# Patient Record
Sex: Female | Born: 1970 | Race: White | Hispanic: No | Marital: Married | Smoking: Current every day smoker
Health system: Southern US, Community
[De-identification: ages and names within clinical notes are randomized; demographics above are authoritative.]

## PROBLEM LIST (undated history)

## (undated) DIAGNOSIS — Z9081 Acquired absence of spleen: Secondary | ICD-10-CM

## (undated) DIAGNOSIS — I81 Portal vein thrombosis: Secondary | ICD-10-CM

## (undated) DIAGNOSIS — Z789 Other specified health status: Secondary | ICD-10-CM

## (undated) DIAGNOSIS — D649 Anemia, unspecified: Secondary | ICD-10-CM

## (undated) HISTORY — DX: Anemia, unspecified: D64.9

## (undated) HISTORY — PX: TUBAL LIGATION: SHX77

## (undated) HISTORY — DX: Portal vein thrombosis: I81

## (undated) HISTORY — DX: Acquired absence of spleen: Z90.81

---

## 1994-01-31 HISTORY — PX: SPLENECTOMY, TOTAL: SHX788

## 1994-01-31 HISTORY — PX: NEPHRECTOMY: SHX65

## 2015-01-14 ENCOUNTER — Emergency Department (HOSPITAL_COMMUNITY): Payer: Self-pay

## 2015-01-14 ENCOUNTER — Encounter (HOSPITAL_COMMUNITY): Payer: Self-pay | Admitting: Emergency Medicine

## 2015-01-14 ENCOUNTER — Inpatient Hospital Stay (HOSPITAL_COMMUNITY)
Admission: EM | Admit: 2015-01-14 | Discharge: 2015-01-19 | DRG: 871 | Disposition: A | Discharge status: Home / Self Care | Payer: Self-pay | Attending: Internal Medicine | Admitting: Internal Medicine

## 2015-01-14 DIAGNOSIS — K819 Cholecystitis, unspecified: Secondary | ICD-10-CM

## 2015-01-14 DIAGNOSIS — Z23 Encounter for immunization: Secondary | ICD-10-CM

## 2015-01-14 DIAGNOSIS — K810 Acute cholecystitis: Secondary | ICD-10-CM | POA: Diagnosis present

## 2015-01-14 DIAGNOSIS — D696 Thrombocytopenia, unspecified: Secondary | ICD-10-CM | POA: Diagnosis present

## 2015-01-14 DIAGNOSIS — Z905 Acquired absence of kidney: Secondary | ICD-10-CM

## 2015-01-14 DIAGNOSIS — I81 Portal vein thrombosis: Secondary | ICD-10-CM | POA: Diagnosis present

## 2015-01-14 DIAGNOSIS — E876 Hypokalemia: Secondary | ICD-10-CM | POA: Diagnosis present

## 2015-01-14 DIAGNOSIS — K219 Gastro-esophageal reflux disease without esophagitis: Secondary | ICD-10-CM | POA: Diagnosis present

## 2015-01-14 DIAGNOSIS — R112 Nausea with vomiting, unspecified: Secondary | ICD-10-CM | POA: Insufficient documentation

## 2015-01-14 DIAGNOSIS — E871 Hypo-osmolality and hyponatremia: Secondary | ICD-10-CM | POA: Diagnosis present

## 2015-01-14 DIAGNOSIS — A419 Sepsis, unspecified organism: Principal | ICD-10-CM | POA: Diagnosis present

## 2015-01-14 DIAGNOSIS — F17210 Nicotine dependence, cigarettes, uncomplicated: Secondary | ICD-10-CM | POA: Diagnosis present

## 2015-01-14 DIAGNOSIS — Z452 Encounter for adjustment and management of vascular access device: Secondary | ICD-10-CM

## 2015-01-14 DIAGNOSIS — R197 Diarrhea, unspecified: Secondary | ICD-10-CM

## 2015-01-14 HISTORY — DX: Other specified health status: Z78.9

## 2015-01-14 LAB — COMPREHENSIVE METABOLIC PANEL
ALBUMIN: 2.8 g/dL — AB (ref 3.5–5.0)
ALT: 15 U/L (ref 14–54)
AST: 29 U/L (ref 15–41)
Alkaline Phosphatase: 88 U/L (ref 38–126)
Anion gap: 10 (ref 5–15)
BUN: 15 mg/dL (ref 6–20)
CHLORIDE: 95 mmol/L — AB (ref 101–111)
CO2: 23 mmol/L (ref 22–32)
Calcium: 8 mg/dL — ABNORMAL LOW (ref 8.9–10.3)
Creatinine, Ser: 0.83 mg/dL (ref 0.44–1.00)
GFR calc Af Amer: >60 mL/min (ref >60)
GFR calc non Af Amer: >60 mL/min (ref >60)
GLUCOSE: 111 mg/dL — AB (ref 65–99)
POTASSIUM: 3.5 mmol/L (ref 3.5–5.1)
Sodium: 128 mmol/L — ABNORMAL LOW (ref 135–145)
Total Bilirubin: 0.5 mg/dL (ref 0.3–1.2)
Total Protein: 6.1 g/dL — ABNORMAL LOW (ref 6.5–8.1)

## 2015-01-14 LAB — CBC WITH DIFFERENTIAL/PLATELET
BASOS ABS: 0 10*3/uL (ref 0.0–0.1)
Basophils Relative: 0 %
Eosinophils Absolute: 0 10*3/uL (ref 0.0–0.7)
Eosinophils Relative: 0 %
HCT: 32.9 % — ABNORMAL LOW (ref 36.0–46.0)
HEMOGLOBIN: 11.5 g/dL — AB (ref 12.0–15.0)
LYMPHS PCT: 5 %
Lymphs Abs: 0.5 10*3/uL — ABNORMAL LOW (ref 0.7–4.0)
MCH: 33.9 pg (ref 26.0–34.0)
MCHC: 35 g/dL (ref 30.0–36.0)
MCV: 97.1 fL (ref 78.0–100.0)
MONO ABS: 0.3 10*3/uL (ref 0.1–1.0)
Monocytes Relative: 3 %
NEUTROS ABS: 9.8 10*3/uL — AB (ref 1.7–7.7)
NEUTROS PCT: 92 %
Platelets: 54 10*3/uL — ABNORMAL LOW (ref 150–400)
RBC: 3.39 MIL/uL — ABNORMAL LOW (ref 3.87–5.11)
RDW: 12.9 % (ref 11.5–15.5)
SMEAR REVIEW: DECREASED
WBC: 10.7 10*3/uL — ABNORMAL HIGH (ref 4.0–10.5)

## 2015-01-14 LAB — I-STAT CHEM 8, ED
BUN: 12 mg/dL (ref 6–20)
CALCIUM ION: 0.97 mmol/L — AB (ref 1.12–1.23)
CREATININE: 0.7 mg/dL (ref 0.44–1.00)
Chloride: 101 mmol/L (ref 101–111)
GLUCOSE: 83 mg/dL (ref 65–99)
HEMATOCRIT: 37 % (ref 36.0–46.0)
HEMOGLOBIN: 12.6 g/dL (ref 12.0–15.0)
Potassium: 4.2 mmol/L (ref 3.5–5.1)
Sodium: 137 mmol/L (ref 135–145)
TCO2: 22 mmol/L (ref 0–100)

## 2015-01-14 LAB — URINALYSIS, ROUTINE W REFLEX MICROSCOPIC
Bilirubin Urine: NEGATIVE
Glucose, UA: NEGATIVE mg/dL
Ketones, ur: NEGATIVE mg/dL
LEUKOCYTES UA: NEGATIVE
NITRITE: NEGATIVE
PROTEIN: 30 mg/dL — AB
Specific Gravity, Urine: 1.01 (ref 1.005–1.030)
pH: 5.5 (ref 5.0–8.0)

## 2015-01-14 LAB — URINE MICROSCOPIC-ADD ON

## 2015-01-14 LAB — PREGNANCY, URINE: PREG TEST UR: NEGATIVE

## 2015-01-14 LAB — I-STAT CG4 LACTIC ACID, ED
LACTIC ACID, VENOUS: 2.84 mmol/L — AB (ref 0.5–2.0)
Lactic Acid, Venous: 3.18 mmol/L (ref 0.5–2.0)

## 2015-01-14 LAB — LIPASE, BLOOD: LIPASE: 22 U/L (ref 11–51)

## 2015-01-14 MED ORDER — IOHEXOL 300 MG/ML  SOLN
100.0000 mL | Freq: Once | INTRAMUSCULAR | Status: AC | PRN
  Administered 2015-01-14: 100 mL via INTRAVENOUS

## 2015-01-14 MED ORDER — FAMOTIDINE IN NACL 20-0.9 MG/50ML-% IV SOLN
20.0000 mg | Freq: Once | INTRAVENOUS | Status: AC
  Administered 2015-01-14: 20 mg via INTRAVENOUS
  Filled 2015-01-14: qty 50

## 2015-01-14 MED ORDER — SODIUM CHLORIDE 0.9 % IV BOLUS (SEPSIS)
1000.0000 mL | Freq: Once | INTRAVENOUS | Status: AC
  Administered 2015-01-14: 1000 mL via INTRAVENOUS

## 2015-01-14 MED ORDER — ONDANSETRON HCL 4 MG/2ML IJ SOLN
4.0000 mg | INTRAMUSCULAR | Status: AC | PRN
  Administered 2015-01-14 (×2): 4 mg via INTRAVENOUS
  Filled 2015-01-14 (×2): qty 2

## 2015-01-14 MED ORDER — IOHEXOL 300 MG/ML  SOLN
50.0000 mL | Freq: Once | INTRAMUSCULAR | Status: AC | PRN
  Administered 2015-01-14: 50 mL via ORAL

## 2015-01-14 MED ORDER — IBUPROFEN 800 MG PO TABS
800.0000 mg | ORAL_TABLET | Freq: Once | ORAL | Status: AC
  Administered 2015-01-14: 800 mg via ORAL

## 2015-01-14 MED ORDER — IBUPROFEN 800 MG PO TABS
ORAL_TABLET | ORAL | Status: AC
  Filled 2015-01-14: qty 1

## 2015-01-14 MED ORDER — SODIUM CHLORIDE 0.9 % IV SOLN
INTRAVENOUS | Status: DC
  Administered 2015-01-15 – 2015-01-16 (×3): via INTRAVENOUS

## 2015-01-14 MED ORDER — PIPERACILLIN-TAZOBACTAM 3.375 G IVPB 30 MIN
3.3750 g | Freq: Once | INTRAVENOUS | Status: AC
  Administered 2015-01-14: 3.375 g via INTRAVENOUS
  Filled 2015-01-14: qty 50

## 2015-01-14 NOTE — ED Notes (Signed)
Pt c/o dizziness, chills and vomiting since Sunday night.
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
## 2015-01-14 NOTE — ED Provider Notes (Signed)
The pt has had GI illness with n/v/d for 3 days - not eating or drinking much today - now with fever and RUQ ttp and mid abd ttp - CT shows some possible cystitis and some cholecysittis - motrin for fever, 3 L of fluids and lactic acid has risen slightly.  BP is 88 systolic (states baseline is 90's) and pulse is 117, meets criteria for sepsis,  Will admit  Meds given in ED:  Medications  0.9 %  sodium chloride infusion (not administered)  piperacillin-tazobactam (ZOSYN) IVPB 3.375 g (3.375 g Intravenous New Bag/Given 01/14/15 2346)  sodium chloride 0.9 % bolus 1,000 mL (0 mLs Intravenous Stopped 01/14/15 2201)  ondansetron (ZOFRAN) injection 4 mg (4 mg Intravenous Given 01/14/15 2159)  famotidine (PEPCID) IVPB 20 mg premix (0 mg Intravenous Stopped 01/14/15 2201)  sodium chloride 0.9 % bolus 1,000 mL (0 mLs Intravenous Stopped 01/14/15 2316)  ibuprofen (ADVIL,MOTRIN) tablet 800 mg (800 mg Oral Given 01/14/15 2315)  iohexol (OMNIPAQUE) 300 MG/ML solution 50 mL (50 mLs Oral Contrast Given 01/14/15 2325)  iohexol (OMNIPAQUE) 300 MG/ML solution 100 mL (100 mLs Intravenous Contrast Given 01/14/15 2325)  sodium chloride 0.9 % bolus 1,000 mL (1,000 mLs Intravenous New Bag/Given 01/14/15 2346)    Consult to hospitalist at 11:55 PM  D/w Dr. Sharl MaLama - will admit to step down - inpatient, holding orders requested  Eber HongBrian Jalayla Chrismer, MD 01/15/15 0005

## 2015-01-15 ENCOUNTER — Inpatient Hospital Stay (HOSPITAL_COMMUNITY): Payer: Self-pay

## 2015-01-15 ENCOUNTER — Encounter (HOSPITAL_COMMUNITY): Payer: Self-pay | Admitting: *Deleted

## 2015-01-15 ENCOUNTER — Observation Stay (HOSPITAL_COMMUNITY): Payer: Self-pay

## 2015-01-15 DIAGNOSIS — R112 Nausea with vomiting, unspecified: Secondary | ICD-10-CM

## 2015-01-15 DIAGNOSIS — R197 Diarrhea, unspecified: Secondary | ICD-10-CM | POA: Insufficient documentation

## 2015-01-15 DIAGNOSIS — K810 Acute cholecystitis: Secondary | ICD-10-CM

## 2015-01-15 DIAGNOSIS — A419 Sepsis, unspecified organism: Secondary | ICD-10-CM | POA: Diagnosis present

## 2015-01-15 DIAGNOSIS — D696 Thrombocytopenia, unspecified: Secondary | ICD-10-CM | POA: Diagnosis present

## 2015-01-15 DIAGNOSIS — E876 Hypokalemia: Secondary | ICD-10-CM | POA: Diagnosis not present

## 2015-01-15 LAB — CBC
HCT: 31.1 % — ABNORMAL LOW (ref 36.0–46.0)
HEMOGLOBIN: 10.7 g/dL — AB (ref 12.0–15.0)
MCH: 33.5 pg (ref 26.0–34.0)
MCHC: 34.4 g/dL (ref 30.0–36.0)
MCV: 97.5 fL (ref 78.0–100.0)
Platelets: 34 10*3/uL — ABNORMAL LOW (ref 150–400)
RBC: 3.19 MIL/uL — AB (ref 3.87–5.11)
RDW: 13 % (ref 11.5–15.5)
WBC: 13.9 10*3/uL — ABNORMAL HIGH (ref 4.0–10.5)

## 2015-01-15 LAB — COMPREHENSIVE METABOLIC PANEL
ALK PHOS: 88 U/L (ref 38–126)
ALT: 43 U/L (ref 14–54)
AST: 82 U/L — AB (ref 15–41)
Albumin: 2.2 g/dL — ABNORMAL LOW (ref 3.5–5.0)
Anion gap: 8 (ref 5–15)
BUN: 16 mg/dL (ref 6–20)
CALCIUM: 6.7 mg/dL — AB (ref 8.9–10.3)
CO2: 19 mmol/L — AB (ref 22–32)
CREATININE: 1 mg/dL (ref 0.44–1.00)
Chloride: 106 mmol/L (ref 101–111)
Glucose, Bld: 93 mg/dL (ref 65–99)
Potassium: 3 mmol/L — ABNORMAL LOW (ref 3.5–5.1)
SODIUM: 133 mmol/L — AB (ref 135–145)
Total Bilirubin: 0.8 mg/dL (ref 0.3–1.2)
Total Protein: 4.7 g/dL — ABNORMAL LOW (ref 6.5–8.1)

## 2015-01-15 LAB — MRSA PCR SCREENING: MRSA by PCR: NEGATIVE

## 2015-01-15 LAB — APTT: aPTT: 41 seconds — ABNORMAL HIGH (ref 24–37)

## 2015-01-15 LAB — LACTIC ACID, PLASMA
LACTIC ACID, VENOUS: 1.5 mmol/L (ref 0.5–2.0)
Lactic Acid, Venous: 1.5 mmol/L (ref 0.5–2.0)

## 2015-01-15 LAB — PROTIME-INR
INR: 1.23 (ref 0.00–1.49)
Prothrombin Time: 15.6 seconds — ABNORMAL HIGH (ref 11.6–15.2)

## 2015-01-15 LAB — PROCALCITONIN: PROCALCITONIN: 33.81 ng/mL

## 2015-01-15 LAB — CORTISOL: Cortisol, Plasma: 13 ug/dL

## 2015-01-15 MED ORDER — SODIUM CHLORIDE 0.9 % IV SOLN: INTRAVENOUS | Status: AC

## 2015-01-15 MED ORDER — HYDROCORTISONE NA SUCCINATE PF 100 MG IJ SOLR
100.0000 mg | Freq: Three times a day (TID) | INTRAMUSCULAR | Status: DC
  Administered 2015-01-15 – 2015-01-16 (×4): 100 mg via INTRAVENOUS
  Filled 2015-01-15 (×4): qty 2

## 2015-01-15 MED ORDER — INFLUENZA VAC SPLIT QUAD 0.5 ML IM SUSY
0.5000 mL | PREFILLED_SYRINGE | INTRAMUSCULAR | Status: AC
  Administered 2015-01-16: 0.5 mL via INTRAMUSCULAR
  Filled 2015-01-15: qty 0.5

## 2015-01-15 MED ORDER — ACETAMINOPHEN 500 MG PO TABS
1000.0000 mg | ORAL_TABLET | Freq: Once | ORAL | Status: AC
  Administered 2015-01-15: 1000 mg via ORAL

## 2015-01-15 MED ORDER — SODIUM CHLORIDE 0.9 % IV BOLUS (SEPSIS)
500.0000 mL | INTRAVENOUS | Status: AC
  Administered 2015-01-15: 500 mL via INTRAVENOUS

## 2015-01-15 MED ORDER — PNEUMOCOCCAL VAC POLYVALENT 25 MCG/0.5ML IJ INJ
0.5000 mL | INJECTION | INTRAMUSCULAR | Status: AC
  Administered 2015-01-16: 0.5 mL via INTRAMUSCULAR
  Filled 2015-01-15: qty 0.5

## 2015-01-15 MED ORDER — SODIUM CHLORIDE 0.9 % IV BOLUS (SEPSIS)
1000.0000 mL | Freq: Once | INTRAVENOUS | Status: AC
  Administered 2015-01-15: 1000 mL via INTRAVENOUS

## 2015-01-15 MED ORDER — ACETAMINOPHEN 325 MG PO TABS
650.0000 mg | ORAL_TABLET | Freq: Four times a day (QID) | ORAL | Status: DC | PRN
  Administered 2015-01-15: 650 mg via ORAL
  Filled 2015-01-15: qty 2

## 2015-01-15 MED ORDER — GUAIFENESIN-DM 100-10 MG/5ML PO SYRP
5.0000 mL | ORAL_SOLUTION | ORAL | Status: DC | PRN
  Administered 2015-01-15 – 2015-01-17 (×10): 5 mL via ORAL
  Filled 2015-01-15 (×10): qty 5

## 2015-01-15 MED ORDER — ONDANSETRON HCL 4 MG PO TABS: 4.0000 mg | ORAL_TABLET | Freq: Four times a day (QID) | ORAL | Status: DC | PRN

## 2015-01-15 MED ORDER — POTASSIUM CHLORIDE 10 MEQ/100ML IV SOLN
10.0000 meq | INTRAVENOUS | Status: AC
  Administered 2015-01-15 (×4): 10 meq via INTRAVENOUS
  Filled 2015-01-15 (×3): qty 100

## 2015-01-15 MED ORDER — ONDANSETRON HCL 4 MG/2ML IJ SOLN: 4.0000 mg | Freq: Three times a day (TID) | INTRAMUSCULAR | Status: AC | PRN

## 2015-01-15 MED ORDER — ACETAMINOPHEN 500 MG PO TABS
ORAL_TABLET | ORAL | Status: AC
  Filled 2015-01-15: qty 2

## 2015-01-15 MED ORDER — SODIUM CHLORIDE 0.9 % IV SOLN: INTRAVENOUS | Status: DC | PRN

## 2015-01-15 MED ORDER — FENTANYL CITRATE (PF) 100 MCG/2ML IJ SOLN: 25.0000 ug | INTRAMUSCULAR | Status: DC | PRN

## 2015-01-15 MED ORDER — SODIUM CHLORIDE 0.9 % IV BOLUS (SEPSIS)
250.0000 mL | Freq: Once | INTRAVENOUS | Status: AC
  Administered 2015-01-15: 250 mL via INTRAVENOUS

## 2015-01-15 MED ORDER — ONDANSETRON HCL 4 MG/2ML IJ SOLN: 4.0000 mg | Freq: Four times a day (QID) | INTRAMUSCULAR | Status: DC | PRN

## 2015-01-15 MED ORDER — PIPERACILLIN-TAZOBACTAM 3.375 G IVPB
3.3750 g | Freq: Three times a day (TID) | INTRAVENOUS | Status: DC
  Administered 2015-01-15 – 2015-01-17 (×7): 3.375 g via INTRAVENOUS
  Filled 2015-01-15 (×6): qty 50

## 2015-01-15 MED ORDER — ACETAMINOPHEN 650 MG RE SUPP: 650.0000 mg | Freq: Four times a day (QID) | RECTAL | Status: DC | PRN
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
## 2015-01-15 NOTE — Progress Notes (Signed)
ANTIBIOTIC CONSULT NOTE - INITIAL  Pharmacy Consult for Zosyn Indication: intra-abdominal infection  Allergies  Allergen Reactions  . Morphine And Related Nausea And Vomiting    Patient Measurements: Height: 5\' 2"  (157.5 cm) Weight: 103 lb 2.8 oz (46.8 kg) IBW/kg (Calculated) : 50.1  Vital Signs: Temp: 98.1 F (36.7 C) (12/15 0400) Temp Source: Oral (12/15 0400) BP: 81/59 mmHg (12/15 0615) Pulse Rate: 73 (12/15 0615) Intake/Output from previous day:   Intake/Output from this shift:    Labs:  Recent Labs  01/14/15 1945 01/14/15 2301 01/15/15 0448  WBC 10.7*  --  13.9*  HGB 11.5* 12.6 10.7*  PLT 54*  --  34*  CREATININE 0.83 0.70 1.00   Estimated Creatinine Clearance: 53 mL/min (by C-G formula based on Cr of 1). No results for input(s): VANCOTROUGH, VANCOPEAK, VANCORANDOM, GENTTROUGH, GENTPEAK, GENTRANDOM, TOBRATROUGH, TOBRAPEAK, TOBRARND, AMIKACINPEAK, AMIKACINTROU, AMIKACIN in the last 72 hours.   Microbiology: Recent Results (from the past 720 hour(s))  Culture, blood (routine x 2)     Status: None (Preliminary result)   Collection Time: 01/15/15  1:12 AM  Result Value Ref Range Status   Specimen Description BLOOD RIGHT HAND  Final   Special Requests   Final    BOTTLES DRAWN AEROBIC AND ANAEROBIC AEB 10CC ANA 6CC   Culture PENDING  Incomplete   Report Status PENDING  Incomplete  Culture, blood (routine x 2)     Status: None (Preliminary result)   Collection Time: 01/15/15  1:24 AM  Result Value Ref Range Status   Specimen Description BLOOD LEFT ANTECUBITAL  Final   Special Requests BOTTLES DRAWN AEROBIC AND ANAEROBIC 8CC EACH  Final   Culture PENDING  Incomplete   Report Status PENDING  Incomplete  MRSA PCR Screening     Status: None   Collection Time: 01/15/15  2:13 AM  Result Value Ref Range Status   MRSA by PCR NEGATIVE NEGATIVE Final    Comment:        The GeneXpert MRSA Assay (FDA approved for NASAL specimens only), is one component of  a comprehensive MRSA colonization surveillance program. It is not intended to diagnose MRSA infection nor to guide or monitor treatment for MRSA infections.     Medical History: Past Medical History  Diagnosis Date  . Medical history non-contributory     Medications:  Scheduled:  . sodium chloride   Intravenous STAT  . [START ON 01/16/2015] Influenza vac split quadrivalent PF  0.5 mL Intramuscular Tomorrow-1000  . piperacillin-tazobactam (ZOSYN)  IV  3.375 g Intravenous 3 times per day  . [START ON 01/16/2015] pneumococcal 23 valent vaccine  0.5 mL Intramuscular Tomorrow-1000   Assessment: 44 yo female admitted with complaints of right upper quadrant pain. Lactic acid 3.18, and CT scan showed pericholecystic fluid, concerning for acute cholecystitis. For abdominal ultrasound this AM. Empiric treatment with Zosyn  Goal of Therapy:  Eradication of infection  Plan:  Zosyn 3.375gm iv Q8H extended dosing interval Follow up culture results  Monitor V/S and labs  Elder CyphersLorie Gaia Gullikson, BS Loura BackPharm D, BCPS Clinical Pharmacist Pager [phone] 01/15/2015,7:47 AM

## 2015-01-15 NOTE — Consult Note (Signed)
Patient seen, chart reviewed.  Full consult to follow pending U/S.
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
## 2015-01-15 NOTE — Care Management Note (Signed)
Case Management Note  Patient Details  Name: Kimberly Hurley MRN: [id number] Date of Birth: 02/18/1970  Subjective/Objective:                  Pt is from home, lives with husband and kids. Pt ind with ADL's. Pt employed but uninsured at this time. Pt states her ins will become active Jan 1st. Pt has been going to clinic in [place] but would like list of PCP'S.   Action/Plan: Unsure at this time if surgery is needed. Pt given list of PCP's in the area and pt will make appointment after Jan 1st. Will cont to follow for DC needs.   Expected Discharge Date:     01/16/2015             Expected Discharge Plan:  Home/Self Care  In-House Referral:  Financial Counselor  Discharge planning Services  CM Consult  Post Acute Care Choice:  NA Choice offered to:  NA  DME Arranged:    DME Agency:     HH Arranged:    HH Agency:     Status of Service:  In process, will continue to follow  Medicare Important Message Given:    Date Medicare IM Given:    Medicare IM give by:    Date Additional Medicare IM Given:    Additional Medicare Important Message give by:     If discussed at Long Length of Stay Meetings, dates discussed:    Additional Comments:  Kimberly Hurley, Kimberly Mumford Demske, RN 01/15/2015, 1:17 PM

## 2015-01-15 NOTE — ED Notes (Signed)
Patient transferred to restroom in w/c

## 2015-01-15 NOTE — Progress Notes (Signed)
Patient admitted by Dr. Sharl MaLama earlier this morning.  Patient seen and examined.  She continues to have abdominal discomfort in the right upper quadrant, nausea and vomiting has improved. She has not had further fevers.   She has been admitted with possible sepsis from abdominal source. This is concerns for cholecystitis. I have requested a surgical consult. LFTs are minimally elevated but she does have pain in RUQ. abd ultrasound is pending.  She continues to be hypotensive, but reports normal blood pressure runs in the 90s. She is unaware of any previous medical problems. Will [facility]. Eventhough she is hypotensive at this time, she does not appear toxic. She is being aggressively hydrated. Will check cortisol and start on hydrocortisone. Will place central venous access and check CVP. May need to start on pressors. May also benefit from arterial line. She is on empiric zosyn and blood cultures are in process. She is significantly thrombocytopenic, which may be related to acute infection/sepsis, although her baseline platelet count is unknown. Continue to follow.  Freddy Kinne

## 2015-01-16 DIAGNOSIS — I81 Portal vein thrombosis: Secondary | ICD-10-CM

## 2015-01-16 LAB — COMPREHENSIVE METABOLIC PANEL
ALBUMIN: 2.3 g/dL — AB (ref 3.5–5.0)
ALT: 33 U/L (ref 14–54)
AST: 35 U/L (ref 15–41)
Alkaline Phosphatase: 54 U/L (ref 38–126)
Anion gap: 6 (ref 5–15)
BUN: 11 mg/dL (ref 6–20)
CHLORIDE: 112 mmol/L — AB (ref 101–111)
CO2: 18 mmol/L — ABNORMAL LOW (ref 22–32)
Calcium: 7.5 mg/dL — ABNORMAL LOW (ref 8.9–10.3)
Creatinine, Ser: 0.72 mg/dL (ref 0.44–1.00)
GFR calc Af Amer: >60 mL/min (ref >60)
GLUCOSE: 200 mg/dL — AB (ref 65–99)
POTASSIUM: 3.7 mmol/L (ref 3.5–5.1)
SODIUM: 136 mmol/L (ref 135–145)
Total Bilirubin: 0.5 mg/dL (ref 0.3–1.2)
Total Protein: 5 g/dL — ABNORMAL LOW (ref 6.5–8.1)

## 2015-01-16 LAB — CBC
HEMATOCRIT: 30.6 % — AB (ref 36.0–46.0)
Hemoglobin: 10.8 g/dL — ABNORMAL LOW (ref 12.0–15.0)
MCH: 33.9 pg (ref 26.0–34.0)
MCHC: 35.3 g/dL (ref 30.0–36.0)
MCV: 95.9 fL (ref 78.0–100.0)
Platelets: 28 10*3/uL — CL (ref 150–400)
RBC: 3.19 MIL/uL — ABNORMAL LOW (ref 3.87–5.11)
RDW: 12.8 % (ref 11.5–15.5)
WBC: 4.4 10*3/uL (ref 4.0–10.5)

## 2015-01-16 LAB — PROTIME-INR
INR: 1.05 (ref 0.00–1.49)
PROTHROMBIN TIME: 13.9 s (ref 11.6–15.2)

## 2015-01-16 LAB — C DIFFICILE QUICK SCREEN W PCR REFLEX
C DIFFICILE (CDIFF) INTERP: NEGATIVE
C Diff antigen: NEGATIVE
C Diff toxin: NEGATIVE

## 2015-01-16 MED ORDER — NICOTINE 21 MG/24HR TD PT24
21.0000 mg | MEDICATED_PATCH | Freq: Every day | TRANSDERMAL | Status: DC
  Administered 2015-01-16 – 2015-01-19 (×4): 21 mg via TRANSDERMAL
  Filled 2015-01-16 (×4): qty 1

## 2015-01-16 MED ORDER — KCL IN DEXTROSE-NACL 20-5-0.45 MEQ/L-%-% IV SOLN
INTRAVENOUS | Status: DC
  Administered 2015-01-16: 1000 mL via INTRAVENOUS
  Administered 2015-01-16 – 2015-01-19 (×7): via INTRAVENOUS

## 2015-01-16 MED ORDER — POTASSIUM CHLORIDE 2 MEQ/ML IV SOLN: INTRAVENOUS | Status: DC
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
## 2015-01-16 NOTE — Progress Notes (Addendum)
PT TRANSFERRING TO ROOM 313 ON TELEMETRY. VSS. NSR. STOOL SPECIMEN SENT TO LAB FOR C- DIFF. TRANSFER REPORT CALLED TO LAUREN RN ON 300.FAXED REQUEST HAS BEEN SENT TO [facility] FOR MEDICAL HX ON PT.

## 2015-01-17 LAB — EXPECTORATED SPUTUM ASSESSMENT W GRAM STAIN, RFLX TO RESP C

## 2015-01-17 LAB — COMPREHENSIVE METABOLIC PANEL
ALT: 26 U/L (ref 14–54)
ANION GAP: 5 (ref 5–15)
AST: 23 U/L (ref 15–41)
Albumin: 2.2 g/dL — ABNORMAL LOW (ref 3.5–5.0)
Alkaline Phosphatase: 62 U/L (ref 38–126)
BUN: 5 mg/dL — ABNORMAL LOW (ref 6–20)
CALCIUM: 8 mg/dL — AB (ref 8.9–10.3)
CHLORIDE: 113 mmol/L — AB (ref 101–111)
CO2: 22 mmol/L (ref 22–32)
Creatinine, Ser: 0.65 mg/dL (ref 0.44–1.00)
GFR calc non Af Amer: >60 mL/min (ref >60)
Glucose, Bld: 118 mg/dL — ABNORMAL HIGH (ref 65–99)
Potassium: 3.2 mmol/L — ABNORMAL LOW (ref 3.5–5.1)
SODIUM: 140 mmol/L (ref 135–145)
Total Bilirubin: 0.6 mg/dL (ref 0.3–1.2)
Total Protein: 4.9 g/dL — ABNORMAL LOW (ref 6.5–8.1)

## 2015-01-17 LAB — CBC
HCT: 31.2 % — ABNORMAL LOW (ref 36.0–46.0)
HEMOGLOBIN: 10.9 g/dL — AB (ref 12.0–15.0)
MCH: 34 pg (ref 26.0–34.0)
MCHC: 34.9 g/dL (ref 30.0–36.0)
MCV: 97.2 fL (ref 78.0–100.0)
Platelets: 40 10*3/uL — ABNORMAL LOW (ref 150–400)
RBC: 3.21 MIL/uL — AB (ref 3.87–5.11)
RDW: 13.6 % (ref 11.5–15.5)
WBC: 14.2 10*3/uL — AB (ref 4.0–10.5)

## 2015-01-17 LAB — EXPECTORATED SPUTUM ASSESSMENT W REFEX TO RESP CULTURE

## 2015-01-17 MED ORDER — AMOXICILLIN-POT CLAVULANATE 875-125 MG PO TABS
1.0000 | ORAL_TABLET | Freq: Two times a day (BID) | ORAL | Status: DC
  Administered 2015-01-17 – 2015-01-19 (×5): 1 via ORAL
  Filled 2015-01-17 (×5): qty 1

## 2015-01-17 MED ORDER — POTASSIUM CHLORIDE CRYS ER 20 MEQ PO TBCR
40.0000 meq | EXTENDED_RELEASE_TABLET | Freq: Once | ORAL | Status: AC
  Administered 2015-01-17: 40 meq via ORAL
  Filled 2015-01-17: qty 2

## 2015-01-17 MED ORDER — PANTOPRAZOLE SODIUM 40 MG IV SOLR
40.0000 mg | Freq: Two times a day (BID) | INTRAVENOUS | Status: DC
  Administered 2015-01-17 – 2015-01-19 (×5): 40 mg via INTRAVENOUS
  Filled 2015-01-17 (×5): qty 40

## 2015-01-17 NOTE — Progress Notes (Signed)
Subjective: Has no abdominal pain. Tolerating diet well.  Objective: Vital signs in last 24 hours: Temp:  [98.1 F (36.7 C)-98.5 F (36.9 C)] 98.2 F (36.8 C) (12/17 0619) Pulse Rate:  [49-65] 59 (12/17 0619) Resp:  [14-27] 18 (12/17 0619) BP: (99-120)/(66-100) 99/66 mmHg (12/17 0619) SpO2:  [59 %-100 %] 59 % (12/17 0619) Last BM Date: 01/14/15  Intake/Output from previous day: 12/16 0701 - 12/17 0700 In: 1295 [P.O.:720; I.V.:525; IV Piggyback:50] Out: -  Intake/Output this shift: Total I/O In: 240 [P.O.:240] Out: -   General appearance: alert, cooperative and no distress GI: soft, non-tender; bowel sounds normal; no masses,  no organomegaly  Lab Results:   Recent Labs  01/16/15 0500 01/17/15 0547  WBC 4.4 14.2*  HGB 10.8* 10.9*  HCT 30.6* 31.2*  PLT 28* PENDING   BMET  Recent Labs  01/16/15 0500 01/17/15 0547  NA 136 140  K 3.7 3.2*  CL 112* 113*  CO2 18* 22  GLUCOSE 200* 118*  BUN 11 5*  CREATININE 0.72 0.65  CALCIUM 7.5* 8.0*   PT/INR  Recent Labs  01/15/15 1028 01/16/15 1025  LABPROT 15.6* 13.9  INR 1.23 1.05    Studies/Results: US Abdomen Complete  01/15/2015  CLINICAL DATA:  Clinical diagnosis of acute cholecystitis, abnormal CT scan of the abdomen and pelvis yesterday EXAM: ULTRASOUND ABDOMEN COMPLETE COMPARISON:  CT scan of the abdomen and pelvis dated January 14, 2015 FINDINGS: Gallbladder: The gallbladder is adequately distended with no evidence of stones or sludge. There is no gallbladder wall thickening. There is pericholecystic fluid and there is a positive sonographic Murphy's sign. Common bile duct: Diameter: 2.4 mm Liver: The hepatic echotexture is mildly heterogeneous. There is no focal mass nor ductal dilation. The portal vein appears thrombosed. There is very hepatic ascites IVC: No abnormality visualized. Pancreas: There is a hyperechoic focus between the head and body of the pancreas measuring 1.1 x 1.2 x 1.3 cm the  pancreatic head and body and tail are unremarkable. Spleen: The spleen is surgically absent Right Kidney: Length: 14.1 cm. Echogenicity within normal limits. No mass or hydronephrosis visualized. Left Kidney: Length: The left kidney is surgically absent. Abdominal aorta: No aneurysm visualized. The distal aorta was obscured by bowel gas. Other findings: None. IMPRESSION: 1. Pericholecystic fluid and positive sonographic Murphy's sign without evidence of gallstones. Findings are consistent with acute cholecystitis. 2. Abnormal appearance of the portal vein suspicious for thrombus. There is perihepatic ascites. Possible lesion at the junction of the pancreatic head and neck not clearly evident on the previous CT scan. 3. These results will be called to the ordering clinician or representative by the Radiologist Assistant, and communication documented in the PACS or zVision Dashboard. Electronically Signed   By: David  [place] M.D.   On: 01/15/2015 11:29   Dg Chest Port 1 View  01/15/2015  CLINICAL DATA:  Central catheter placement EXAM: PORTABLE CHEST 1 VIEW COMPARISON:  January 14, 2015 FINDINGS: Central catheter tip is in the region of the superior vena cava slightly proximal to the cavoatrial junction. No pneumothorax. There is no edema or consolidation. The heart size and pulmonary vascularity are normal. No adenopathy. There is mid thoracic dextroscoliosis with thoracolumbar levoscoliosis. There are surgical clips in the medial left upper abdomen region. IMPRESSION: Central catheter tip in superior vena cava. No pneumothorax. No edema or consolidation. Stable scoliosis. Electronically Signed   By: Bretta Bang III M.D.   On: 01/15/2015 16:25    Anti-infectives: Anti-infectives  Start     Dose/Rate Route Frequency Ordered Stop   01/15/15 0800  piperacillin-tazobactam (ZOSYN) IVPB 3.375 g     3.375 g 12.5 mL/hr over 240 Minutes Intravenous 3 times per day 01/15/15 0746     01/14/15 2330   piperacillin-tazobactam (ZOSYN) IVPB 3.375 g     3.375 g 100 mL/hr over 30 Minutes Intravenous  Once 01/14/15 2320 01/15/15 0053      Assessment/Plan: Impression: Patient has been moved to regular floor. He is tolerating diet well. Denies any abdominal pain. Etiology of sepsis still in question. Patient has no symptoms of acute cholecystitis. No need for acute surgical intervention. She may need further workup for her portal venous thrombosis. May need further hematologic workup for hypercoagulable state. Will follow-up peripherally with you.  LOS: 2 days    Nialah Saravia A 01/17/2015

## 2015-01-17 NOTE — Progress Notes (Signed)
[facility] PROGRESS NOTE  Kimberly Hurley WUJ:[id number]:[id number] DOB: 06/08/1970 DOA: 01/14/2015 PCP: No PCP Per Patient  Assessment/Plan: 1. Sepsis. WBC 14.2, lactic acid normal, afebrile. BPs have been low however patient states she typically stays around 90 systolic. Will transition to oral abx and monitor closely. BC are pending. Stool negative for c. Diff.  2. Thrombocytopenia, improving. Unknown if chronic although patient does not report any hx of it.  No evidence of bleeding at this time. Hematology consulted, will continue to monitor. 3. Portal vein thrombosis. Not a candidate for anticoagulation given ongoing thrombocytopenia. Will request hematology input as to whether she needs anticoagulation at all. I suspect she may have an element of underlying cirrhosis.  Will check viral hepatitis panel.  4. Acute versus chronic cholecystitis, revealed on abdominal US. Patient is not having any abdominal pain or vomiting. LFTs and lipase wnl. Surgery following, does not recommend acute surgical intervention at this time. Clinically she is improving and no longer has any abdominal pain. No fever. Will transition abx to PO.  5. Hypokalemia, replace. 6. Diarrhea, improving. Stool negative for c.diff  7. Hypotension, resolved with IVF. Cortisol is normal so steroids discontinued.  Patient's baseline SBP is around 90.  8. Nausea, vomiting, and diarrhea. Improving with antiemetics. Advance diet as tolerated.   Code Status: Full DVT prophylaxis: SCDs Family Communication:  Discussed with patient who understands and has no concerns at this time. Disposition Plan: Discharge when improved.   Consultants:  Surgery  Procedures:  none  Antibiotics:  Zosyn 12/15>>12/17  Augmentin 12/17>>  HPI/Subjective: Feels okay. Has some acid reflux and a cough. Diarrhea is improving.   Objective: Filed Vitals:   01/16/15 2205 01/17/15 0619  BP: 106/76 99/66  Pulse: 65 59  Temp: 98.5 F (36.9 C) 98.2 F  (36.8 C)  Resp: 18 18    Intake/Output Summary (Last 24 hours) at 01/17/15 0811 Last data filed at 01/16/15 1400  Gross per 24 hour  Intake   1170 ml  Output      0 ml  Net   1170 ml   Filed Weights   01/14/15 1858 01/15/15 0147 01/16/15 0500  Weight: 45.813 kg (101 lb) 46.8 kg (103 lb 2.8 oz) 52.9 kg (116 lb 10 oz)    Exam:   General: NAD, looks comfortable  Cardiovascular: RRR, S1, S2   Respiratory: clear bilaterally, No wheezing, rales or rhonchi  Abdomen: soft, non tender, no distention , bowel sounds normal  Musculoskeletal: No edema b/l  Data Reviewed: Basic Metabolic Panel:  Recent Labs Lab 01/14/15 1945 01/14/15 2301 01/15/15 0448 01/16/15 0500 01/17/15 0547  NA 128* 137 133* 136 140  K 3.5 4.2 3.0* 3.7 3.2*  CL 95* 101 106 112* 113*  CO2 23  --  19* 18* 22  GLUCOSE 111* 83 93 200* 118*  BUN 15 12 16 11  5*  CREATININE 0.83 0.70 1.00 0.72 0.65  CALCIUM 8.0*  --  6.7* 7.5* 8.0*   Liver Function Tests:  Recent Labs Lab 01/14/15 1945 01/15/15 0448 01/16/15 0500 01/17/15 0547  AST 29 82* 35 23  ALT 15 43 33 26  ALKPHOS 88 88 54 62  BILITOT 0.5 0.8 0.5 0.6  PROT 6.1* 4.7* 5.0* 4.9*  ALBUMIN 2.8* 2.2* 2.3* 2.2*    Recent Labs Lab 01/14/15 1945  LIPASE 22    CBC:  Recent Labs Lab 01/14/15 1945 01/14/15 2301 01/15/15 0448 01/16/15 0500  WBC 10.7*  --  13.9* 4.4  NEUTROABS 9.8*  --   --   --  HGB 11.5* 12.6 10.7* 10.8*  HCT 32.9* 37.0 31.1* 30.6*  MCV 97.1  --  97.5 95.9  PLT 54*  --  34* 28*      Recent Results (from the past 240 hour(s))  Culture, blood (routine x 2)     Status: None (Preliminary result)   Collection Time: 01/15/15  1:12 AM  Result Value Ref Range Status   Specimen Description BLOOD RIGHT HAND  Final   Special Requests   Final    BOTTLES DRAWN AEROBIC AND ANAEROBIC AEB=10CC ANA=6CC   Culture NO GROWTH 2 DAYS  Final   Report Status PENDING  Incomplete  Culture, blood (routine x 2)     Status: None  (Preliminary result)   Collection Time: 01/15/15  1:24 AM  Result Value Ref Range Status   Specimen Description BLOOD LEFT ANTECUBITAL  Final   Special Requests BOTTLES DRAWN AEROBIC AND ANAEROBIC 8CC EACH  Final   Culture NO GROWTH 2 DAYS  Final   Report Status PENDING  Incomplete  MRSA PCR Screening     Status: None   Collection Time: 01/15/15  2:13 AM  Result Value Ref Range Status   MRSA by PCR NEGATIVE NEGATIVE Final    Comment:        The GeneXpert MRSA Assay (FDA approved for NASAL specimens only), is one component of a comprehensive MRSA colonization surveillance program. It is not intended to diagnose MRSA infection nor to guide or monitor treatment for MRSA infections.   C difficile quick scan w PCR reflex     Status: None   Collection Time: 01/16/15  2:54 PM  Result Value Ref Range Status   C Diff antigen NEGATIVE NEGATIVE Final   C Diff toxin NEGATIVE NEGATIVE Final   C Diff interpretation Negative for toxigenic C. difficile  Final     Studies: US Abdomen Complete  01/15/2015  CLINICAL DATA:  Clinical diagnosis of acute cholecystitis, abnormal CT scan of the abdomen and pelvis yesterday EXAM: ULTRASOUND ABDOMEN COMPLETE COMPARISON:  CT scan of the abdomen and pelvis dated January 14, 2015 FINDINGS: Gallbladder: The gallbladder is adequately distended with no evidence of stones or sludge. There is no gallbladder wall thickening. There is pericholecystic fluid and there is a positive sonographic Murphy's sign. Common bile duct: Diameter: 2.4 mm Liver: The hepatic echotexture is mildly heterogeneous. There is no focal mass nor ductal dilation. The portal vein appears thrombosed. There is very hepatic ascites IVC: No abnormality visualized. Pancreas: There is a hyperechoic focus between the head and body of the pancreas measuring 1.1 x 1.2 x 1.3 cm the pancreatic head and body and tail are unremarkable. Spleen: The spleen is surgically absent Right Kidney: Length: 14.1 cm.  Echogenicity within normal limits. No mass or hydronephrosis visualized. Left Kidney: Length: The left kidney is surgically absent. Abdominal aorta: No aneurysm visualized. The distal aorta was obscured by bowel gas. Other findings: None. IMPRESSION: 1. Pericholecystic fluid and positive sonographic Murphy's sign without evidence of gallstones. Findings are consistent with acute cholecystitis. 2. Abnormal appearance of the portal vein suspicious for thrombus. There is perihepatic ascites. Possible lesion at the junction of the pancreatic head and neck not clearly evident on the previous CT scan. 3. These results will be called to the ordering clinician or representative by the Radiologist Assistant, and communication documented in the PACS or zVision Dashboard. Electronically Signed   By: David  [place] M.D.   On: 01/15/2015 11:29   Dg Chest [facility]  01/15/2015  CLINICAL DATA:  Central catheter placement EXAM: PORTABLE CHEST 1 VIEW COMPARISON:  January 14, 2015 FINDINGS: Central catheter tip is in the region of the superior vena cava slightly proximal to the cavoatrial junction. No pneumothorax. There is no edema or consolidation. The heart size and pulmonary vascularity are normal. No adenopathy. There is mid thoracic dextroscoliosis with thoracolumbar levoscoliosis. There are surgical clips in the medial left upper abdomen region. IMPRESSION: Central catheter tip in superior vena cava. No pneumothorax. No edema or consolidation. Stable scoliosis. Electronically Signed   By: Bretta Bang III M.D.   On: 01/15/2015 16:25    Scheduled Meds: . nicotine  21 mg Transdermal Daily  . piperacillin-tazobactam (ZOSYN)  IV  3.375 g Intravenous 3 times per day   Continuous Infusions: . dextrose 5 % and 0.45 % NaCl with KCl 20 mEq/L 100 mL/hr at 01/17/15 1191    Active Problems:   Sepsis (HCC)   Thrombocytopenia (HCC)   Cholecystitis, acute   Hypokalemia   Nausea vomiting and diarrhea   Time  spent: 25 minutes   Kimberly Hurley. MD [facility] Pager [phone]. If 7PM-7AM, please contact night-coverage at [email or web address], password [facility] 01/17/2015, 8:11 AM  LOS: 2 days     By signing my name below, I, Burnett Harry, attest that this documentation has been prepared under the direction and in the presence of [facility]. MD Electronically Signed: Burnett Harry, Scribe. 01/17/2015 12:51pm  I, Dr. Erick Blinks, personally performed the services described in this documentaiton. All medical record entries made by the scribe were at my direction and in my presence. I have reviewed the chart and agree that the record reflects my personal performance and is accurate and complete  Erick Blinks, MD, 01/17/2015 1:04 PM

## 2015-01-18 LAB — COMPREHENSIVE METABOLIC PANEL
ALK PHOS: 59 U/L (ref 38–126)
ALT: 20 U/L (ref 14–54)
ANION GAP: 5 (ref 5–15)
AST: 19 U/L (ref 15–41)
Albumin: 2.2 g/dL — ABNORMAL LOW (ref 3.5–5.0)
BILIRUBIN TOTAL: 1.1 mg/dL (ref 0.3–1.2)
BUN: <5 mg/dL — ABNORMAL LOW (ref 6–20)
CALCIUM: 7.7 mg/dL — AB (ref 8.9–10.3)
CHLORIDE: 108 mmol/L (ref 101–111)
CO2: 24 mmol/L (ref 22–32)
CREATININE: 0.6 mg/dL (ref 0.44–1.00)
Glucose, Bld: 89 mg/dL (ref 65–99)
Potassium: 3.5 mmol/L (ref 3.5–5.1)
Sodium: 137 mmol/L (ref 135–145)
Total Protein: 5.1 g/dL — ABNORMAL LOW (ref 6.5–8.1)

## 2015-01-18 LAB — HEPATITIS PANEL, ACUTE
HCV Ab: <0.1 s/co ratio (ref 0.0–0.9)
HEP B C IGM: NEGATIVE
HEP B S AG: NEGATIVE
Hep A IgM: NEGATIVE

## 2015-01-18 LAB — CBC
HCT: 31.1 % — ABNORMAL LOW (ref 36.0–46.0)
HEMOGLOBIN: 10.7 g/dL — AB (ref 12.0–15.0)
MCH: 33.9 pg (ref 26.0–34.0)
MCHC: 34.4 g/dL (ref 30.0–36.0)
MCV: 98.4 fL (ref 78.0–100.0)
PLATELETS: 69 10*3/uL — AB (ref 150–400)
RBC: 3.16 MIL/uL — AB (ref 3.87–5.11)
RDW: 13.1 % (ref 11.5–15.5)
WBC: 7.8 10*3/uL (ref 4.0–10.5)

## 2015-01-18 MED ORDER — APIXABAN 5 MG PO TABS: 5.0000 mg | ORAL_TABLET | Freq: Two times a day (BID) | ORAL | Status: DC

## 2015-01-18 MED ORDER — APIXABAN 5 MG PO TABS
10.0000 mg | ORAL_TABLET | Freq: Two times a day (BID) | ORAL | Status: DC
  Administered 2015-01-18: 10 mg via ORAL
  Administered 2015-01-19: 5 mg via ORAL
  Filled 2015-01-18 (×2): qty 2

## 2015-01-18 NOTE — Progress Notes (Signed)
ANTICOAGULATION CONSULT NOTE - Initial Consult  Pharmacy Consult for Apixaban Indication: Portal Vein thrombosis  Allergies  Allergen Reactions  . Morphine And Related Nausea And Vomiting    Patient Measurements: Height: 5\' 2"  (157.5 cm) Weight: 116 lb 10 oz (52.9 kg) IBW/kg (Calculated) : 50.1  Vital Signs: Temp: 99.1 F (37.3 C) (12/18 1437) Temp Source: Oral (12/18 1437) BP: 110/72 mmHg (12/18 1437) Pulse Rate: 84 (12/18 1437)  Labs:  Recent Labs  01/16/15 0500 01/16/15 1025 01/17/15 0547 01/18/15 0552  HGB 10.8*  --  10.9* 10.7*  HCT 30.6*  --  31.2* 31.1*  PLT 28*  --  40* 69*  LABPROT  --  13.9  --   --   INR  --  1.05  --   --   CREATININE 0.72  --  0.65 0.60    Estimated Creatinine Clearance: 71 mL/min (by C-G formula based on Cr of 0.6).   Medical History: Past Medical History  Diagnosis Date  . Medical history non-contributory     Medications:  No prescriptions prior to admission    Assessment: 44 yo female admitted with sepsis that is resolving. Thrombocytopenia is improving. Pt is also diagnosed with portal vein thrombosis of unclear etiology. Oncology recommended anticoagulation and f/u with hematology clinic. To start eliquis (apixaban) for treatment of thrombosis.  Goal of Therapy:  Monitor platelets by anticoagulation protocol: Yes   Plan:  Apixaban 10mg  po bid for 7 days, followed by 5mg  BID Monitor for s/s of bleeding Monitor CBC and platelets Educate pt on apixaban  Dutch QuintPoole, Zakariah Dejarnette L 01/18/2015,3:59 PM

## 2015-01-18 NOTE — Progress Notes (Signed)
[facility] PROGRESS NOTE  Kimberly Hurley ZOX:[id number] DOB: 1970-06-15 DOA: 01/14/2015 PCP: No PCP Per Patient  Assessment/Plan: 1. Sepsis, appears resolved following IVF and IV abx. WBC and lactic acid normal, afebrile. BPs have been low however patient states she typically stays around 90 systolic. Will continue oral abx and monitor closely. BC are pending. Stool negative for c. Diff.  2. Thrombocytopenia, improving. Possibly related to sepsis. Unknown if chronic although patient does not report any hx of it.  No evidence of bleeding at this time.  3. Portal vein thrombosis. Unclear etiology. Discussed with Dr. Galen Manila who recommends starting anticoagulation and close follow up with hematology clinic. 4. Acute versus chronic cholecystitis, revealed on abdominal US. Patient is not having any abdominal pain or vomiting. LFTs and lipase wnl. Surgery following, does not recommend acute surgical intervention at this time. Clinically she is improving and no longer has any abdominal pain. No fever. Continue PO abx.  5. Hypokalemia, replaced. 6. Diarrhea, improving. Stool negative for c.diff  7. Hypotension, resolved with IVF. Cortisol is normal so steroids discontinued.  Patient's baseline SBP is around 90.  8. Nausea, vomiting, and diarrhea. Improving with antiemetics. Tolerating a normal diet well.    Code Status: Full DVT prophylaxis: SCDs Family Communication:  Discussed with patient who understands and has no concerns at this time. Disposition Plan: Discharge when improved.   Consultants:  Surgery  Procedures:  none  Antibiotics:  Zosyn 12/15>>12/17  Augmentin 12/17>>  HPI/Subjective: Feels good. Acid reflux is much improved. Sitting up in bed eating.   Objective: Filed Vitals:   01/17/15 2220 01/18/15 0642  BP: 111/69 108/67  Pulse: 79 94  Temp: 98.9 F (37.2 C) 99.4 F (37.4 C)  Resp: 20 20    Intake/Output Summary (Last 24 hours) at 01/18/15 0807 Last data  filed at 01/18/15 0604  Gross per 24 hour  Intake 3293.33 ml  Output      0 ml  Net 3293.33 ml   Filed Weights   01/14/15 1858 01/15/15 0147 01/16/15 0500  Weight: 45.813 kg (101 lb) 46.8 kg (103 lb 2.8 oz) 52.9 kg (116 lb 10 oz)    Exam:   General: NAD, looks comfortable  Cardiovascular: RRR, S1, S2   Respiratory: clear bilaterally, No wheezing, rales or rhonchi  Abdomen: soft, non tender, no distention , bowel sounds normal  Musculoskeletal: No edema b/l  Data Reviewed: Basic Metabolic Panel:  Recent Labs Lab 01/14/15 1945 01/14/15 2301 01/15/15 0448 01/16/15 0500 01/17/15 0547 01/18/15 0552  NA 128* 137 133* 136 140 137  K 3.5 4.2 3.0* 3.7 3.2* 3.5  CL 95* 101 106 112* 113* 108  CO2 23  --  19* 18* 22 24  GLUCOSE 111* 83 93 200* 118* 89  BUN 5* <5*  CREATININE 0.83 0.70 1.00 0.72 0.65 0.60  CALCIUM 8.0*  --  6.7* 7.5* 8.0* 7.7*   Liver Function Tests:  Recent Labs Lab 01/14/15 1945 01/15/15 0448 01/16/15 0500 01/17/15 0547 01/18/15 0552  AST 29 82* 35 23 19  ALT 15 43 33 26 20  ALKPHOS 88 88 54 62 59  BILITOT 0.5 0.8 0.5 0.6 1.1  PROT 6.1* 4.7* 5.0* 4.9* 5.1*  ALBUMIN 2.8* 2.2* 2.3* 2.2* 2.2*    Recent Labs Lab 01/14/15 1945  LIPASE 22    CBC:  Recent Labs Lab 01/14/15 1945 01/14/15 2301 01/15/15 0448 01/16/15 0500 01/17/15 0547 01/18/15 0552  WBC 10.7*  --  13.9* 4.4 14.2*  7.8  NEUTROABS 9.8*  --   --   --   --   --   HGB 11.5* 12.6 10.7* 10.8* 10.9* 10.7*  HCT 32.9* 37.0 31.1* 30.6* 31.2* 31.1*  MCV 97.1  --  97.5 95.9 97.2 98.4  PLT 54*  --  34* 28* 40* 69*      Recent Results (from the past 240 hour(s))  Culture, blood (routine x 2)     Status: None (Preliminary result)   Collection Time: 01/15/15  1:12 AM  Result Value Ref Range Status   Specimen Description BLOOD RIGHT HAND  Final   Special Requests   Final    BOTTLES DRAWN AEROBIC AND ANAEROBIC AEB=10CC ANA=6CC   Culture NO GROWTH 3 DAYS  Final   Report  Status PENDING  Incomplete  Culture, blood (routine x 2)     Status: None (Preliminary result)   Collection Time: 01/15/15  1:24 AM  Result Value Ref Range Status   Specimen Description BLOOD LEFT ANTECUBITAL  Final   Special Requests BOTTLES DRAWN AEROBIC AND ANAEROBIC 8CC EACH  Final   Culture NO GROWTH 3 DAYS  Final   Report Status PENDING  Incomplete  MRSA PCR Screening     Status: None   Collection Time: 01/15/15  2:13 AM  Result Value Ref Range Status   MRSA by PCR NEGATIVE NEGATIVE Final    Comment:        The GeneXpert MRSA Assay (FDA approved for NASAL specimens only), is one component of a comprehensive MRSA colonization surveillance program. It is not intended to diagnose MRSA infection nor to guide or monitor treatment for MRSA infections.   C difficile quick scan w PCR reflex     Status: None   Collection Time: 01/16/15  2:54 PM  Result Value Ref Range Status   C Diff antigen NEGATIVE NEGATIVE Final   C Diff toxin NEGATIVE NEGATIVE Final   C Diff interpretation Negative for toxigenic C. difficile  Final  Culture, expectorated sputum-assessment     Status: None   Collection Time: 01/17/15 11:47 AM  Result Value Ref Range Status   Specimen Description SPUTUM  Final   Special Requests NONE  Final   Sputum evaluation   Final    THIS SPECIMEN IS ACCEPTABLE. RESPIRATORY CULTURE REPORT TO FOLLOW.   Report Status 01/17/2015 FINAL  Final     Studies: No results found.  Scheduled Meds: . amoxicillin-clavulanate  1 tablet Oral Q12H  . nicotine  21 mg Transdermal Daily  . pantoprazole (PROTONIX) IV  40 mg Intravenous Q12H   Continuous Infusions: . dextrose 5 % and 0.45 % NaCl with KCl 20 mEq/L 100 mL/hr at 01/18/15 8119    Active Problems:   Sepsis (HCC)   Thrombocytopenia (HCC)   Cholecystitis, acute   Hypokalemia   Nausea vomiting and diarrhea   Time spent: 25 minutes   [name]. MD [facility] Pager [phone]. If 7PM-7AM, please contact  night-coverage at [email or web address], password [facility] 01/18/2015, 8:07 AM  LOS: 3 days     By signing my name below, I, Burnett Harry, attest that this documentation has been prepared under the direction and in the presence of [facility]. MD Electronically Signed: Burnett Harry, Scribe. 01/18/2015 12:01pm  I, Dr. Erick Blinks, personally performed the services described in this documentaiton. All medical record entries made by the scribe were at my direction and in my presence. I have reviewed the chart and agree that the record reflects my personal performance  and is accurate and complete  Erick BlinksJehanzeb Tillmon Kisling, MD, 01/18/2015 3:44 PM

## 2015-01-18 NOTE — Progress Notes (Signed)
ANTIBIOTIC CONSULT NOTE - INITIAL  Pharmacy Consult for Zosyn Indication: intra-abdominal infection  Allergies  Allergen Reactions  . Morphine And Related Nausea And Vomiting    Patient Measurements: Height:  (157.5 cm) Weight: 116 lb 10 oz (52.9 kg) IBW/kg (Calculated) : 50.1  Vital Signs: Temp: 99.4 F (37.4 C) (12/18 0642) Temp Source: Oral (12/18 1610) BP: 108/67 mmHg (12/18 0642) Pulse Rate: 94 (12/18 0642) Intake/Output from previous day: 12/17 0701 - 12/18 0700 In: 3293.3 [P.O.:840; I.V.:2453.3] Out: -  Intake/Output from this shift:    Labs:  Recent Labs  01/16/15 0500 01/17/15 0547 01/18/15 0552  WBC 4.4 14.2* 7.8  HGB 10.8* 10.9* 10.7*  PLT 28* 40* 69*  CREATININE 0.72 0.65 0.60   Estimated Creatinine Clearance: 71 mL/min (by C-G formula based on Cr of 0.6). No results for input(s): VANCOTROUGH, VANCOPEAK, VANCORANDOM, GENTTROUGH, GENTPEAK, GENTRANDOM, TOBRATROUGH, TOBRAPEAK, TOBRARND, AMIKACINPEAK, AMIKACINTROU, AMIKACIN in the last 72 hours.   Microbiology: Recent Results (from the past 720 hour(s))  Culture, blood (routine x 2)     Status: None (Preliminary result)   Collection Time: 01/15/15  1:12 AM  Result Value Ref Range Status   Specimen Description BLOOD RIGHT HAND  Final   Special Requests   Final    BOTTLES DRAWN AEROBIC AND ANAEROBIC AEB=10CC ANA=6CC   Culture NO GROWTH 3 DAYS  Final   Report Status PENDING  Incomplete  Culture, blood (routine x 2)     Status: None (Preliminary result)   Collection Time: 01/15/15  1:24 AM  Result Value Ref Range Status   Specimen Description BLOOD LEFT ANTECUBITAL  Final   Special Requests BOTTLES DRAWN AEROBIC AND ANAEROBIC 8CC EACH  Final   Culture NO GROWTH 3 DAYS  Final   Report Status PENDING  Incomplete  MRSA PCR Screening     Status: None   Collection Time: 01/15/15  2:13 AM  Result Value Ref Range Status   MRSA by PCR NEGATIVE NEGATIVE Final    Comment:        The GeneXpert MRSA Assay  (FDA approved for NASAL specimens only), is one component of a comprehensive MRSA colonization surveillance program. It is not intended to diagnose MRSA infection nor to guide or monitor treatment for MRSA infections.   C difficile quick scan w PCR reflex     Status: None   Collection Time: 01/16/15  2:54 PM  Result Value Ref Range Status   C Diff antigen NEGATIVE NEGATIVE Final   C Diff toxin NEGATIVE NEGATIVE Final   C Diff interpretation Negative for toxigenic C. difficile  Final  Culture, expectorated sputum-assessment     Status: None   Collection Time: 01/17/15 11:47 AM  Result Value Ref Range Status   Specimen Description SPUTUM  Final   Special Requests NONE  Final   Sputum evaluation   Final    THIS SPECIMEN IS ACCEPTABLE. RESPIRATORY CULTURE REPORT TO FOLLOW.   Report Status 01/17/2015 FINAL  Final  Culture, respiratory (NON-Expectorated)     Status: None (Preliminary result)   Collection Time: 01/17/15 11:47 AM  Result Value Ref Range Status   Specimen Description SPUTUM  Final   Special Requests NONE  Final   Gram Stain PENDING  Incomplete   Culture   Final    Culture reincubated for better growth Performed at [facility]    Report Status PENDING  Incomplete    Medical History: Past Medical History  Diagnosis Date  . Medical history non-contributory  Medications:  Scheduled:  . amoxicillin-clavulanate  1 tablet Oral Q12H  . nicotine  21 mg Transdermal Daily  . pantoprazole (PROTONIX) IV  40 mg Intravenous Q12H   Assessment: 44 yo female admitted with complaints of right upper quadrant pain.   Day #4 zosyn. Blood cx ngtd. Afebrile. Cdiff is neg.. No surgical intervention warranted. WBC improved. Hypokalemic. Platelets 69k. improved.Unknown etiology, MD aware. No bleeding or petechiae. Patient feels better. No abdominal pain . Tolerating diet. Will continue to monitor.  Goal of Therapy:  Eradication of infection  Plan:  Zosyn 3.375gm iv  Q8H extended dosing interval Follow up culture results  Monitor V/S and labs  Elder CyphersLorie Ticia Virgo, BS Loura BackPharm D, BCPS Clinical Pharmacist Pager [phone] 01/18/2015,11:33 AM

## 2015-01-19 LAB — CBC
HCT: 29 % — ABNORMAL LOW (ref 36.0–46.0)
Hemoglobin: 10.2 g/dL — ABNORMAL LOW (ref 12.0–15.0)
MCH: 34.5 pg — AB (ref 26.0–34.0)
MCHC: 35.2 g/dL (ref 30.0–36.0)
MCV: 98 fL (ref 78.0–100.0)
PLATELETS: 121 10*3/uL — AB (ref 150–400)
RBC: 2.96 MIL/uL — AB (ref 3.87–5.11)
RDW: 12.7 % (ref 11.5–15.5)
WBC: 10.2 10*3/uL (ref 4.0–10.5)

## 2015-01-19 MED ORDER — APIXABAN 5 MG PO TABS: 10.0000 mg | ORAL_TABLET | Freq: Two times a day (BID) | ORAL | Status: DC

## 2015-01-19 MED ORDER — PANTOPRAZOLE SODIUM 40 MG PO TBEC: 40.0000 mg | DELAYED_RELEASE_TABLET | Freq: Every day | ORAL | Status: AC

## 2015-01-19 MED ORDER — AMOXICILLIN-POT CLAVULANATE 875-125 MG PO TABS: 1.0000 | ORAL_TABLET | Freq: Two times a day (BID) | ORAL | Status: DC

## 2015-01-19 MED ORDER — NICOTINE 14 MG/24HR TD PT24: 21.0000 mg | MEDICATED_PATCH | Freq: Every day | TRANSDERMAL | Status: DC

## 2015-01-19 MED ORDER — APIXABAN 5 MG PO TABS: 5.0000 mg | ORAL_TABLET | Freq: Two times a day (BID) | ORAL | Status: DC
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
## 2015-01-19 NOTE — Care Management Note (Signed)
Case Management Note  Patient Details  Name: Kimberly Hurley MRN: [id number] Date of Birth: 06/20/1970  Subjective/Objective:                    Action/Plan:   Expected Discharge Date:                  Expected Discharge Plan:  Home/Self Care  In-House Referral:  Financial Counselor  Discharge planning Services  CM Consult  Post Acute Care Choice:  NA Choice offered to:  NA  DME Arranged:    DME Agency:     HH Arranged:    HH Agency:     Status of Service:  Completed, signed off  Medicare Important Message Given:    Date Medicare IM Given:    Medicare IM give by:    Date Additional Medicare IM Given:    Additional Medicare Important Message give by:     If discussed at Long Length of Stay Meetings, dates discussed:    Additional Comments: Pt discharged home today. Pt given eliquis card for 30 days free. Pt also educated on patient assistance program for assistance with cost of drug once 30 day free card runs out. Pt denied any need for MATCH at this time. Pt also has list of PCP in area once insurance starts. Pt encouraged to call [place] schedule follow up appt. Pt and pts nurse aware of discharge arrangements. Arlyss QueenBlackwell, Maquita Sandoval [place], RN 01/19/2015, 11:31 AM

## 2015-01-20 LAB — CULTURE, RESPIRATORY
CULTURE: NORMAL
GRAM STAIN: NONE SEEN

## 2015-01-20 LAB — CULTURE, BLOOD (ROUTINE X 2)
CULTURE: NO GROWTH
Culture: NO GROWTH

## 2015-01-29 ENCOUNTER — Other Ambulatory Visit (HOSPITAL_COMMUNITY): Payer: Self-pay | Admitting: Oncology

## 2015-01-29 ENCOUNTER — Ambulatory Visit (HOSPITAL_COMMUNITY): Payer: Self-pay | Admitting: Hematology & Oncology

## 2015-02-11 ENCOUNTER — Encounter (HOSPITAL_COMMUNITY): Payer: Self-pay | Admitting: Oncology

## 2015-02-11 ENCOUNTER — Other Ambulatory Visit (HOSPITAL_COMMUNITY)
Admission: RE | Admit: 2015-02-11 | Discharge: 2015-02-11 | Disposition: A | Discharge status: Home / Self Care | Payer: MEDICAID | Source: Ambulatory Visit | Attending: Oncology | Admitting: Oncology

## 2015-02-11 ENCOUNTER — Ambulatory Visit (HOSPITAL_COMMUNITY): Payer: Self-pay | Admitting: Hematology & Oncology

## 2015-02-11 ENCOUNTER — Encounter (HOSPITAL_COMMUNITY): Payer: Managed Care, Other (non HMO) | Attending: Oncology | Admitting: Oncology

## 2015-02-11 ENCOUNTER — Encounter (HOSPITAL_COMMUNITY): Payer: Managed Care, Other (non HMO)

## 2015-02-11 VITALS — BP 120/53 | HR 76 | Temp 98.7°F | Resp 18 | Wt 97.4 lb

## 2015-02-11 DIAGNOSIS — D696 Thrombocytopenia, unspecified: Secondary | ICD-10-CM | POA: Diagnosis not present

## 2015-02-11 DIAGNOSIS — D649 Anemia, unspecified: Secondary | ICD-10-CM | POA: Diagnosis not present

## 2015-02-11 DIAGNOSIS — Z9081 Acquired absence of spleen: Secondary | ICD-10-CM | POA: Diagnosis not present

## 2015-02-11 DIAGNOSIS — I81 Portal vein thrombosis: Secondary | ICD-10-CM | POA: Insufficient documentation

## 2015-02-11 HISTORY — DX: Anemia, unspecified: D64.9

## 2015-02-11 HISTORY — DX: Portal vein thrombosis: I81

## 2015-02-11 HISTORY — DX: Acquired absence of spleen: Z90.81

## 2015-02-11 LAB — CBC WITH DIFFERENTIAL/PLATELET
BASOS ABS: 0.1 10*3/uL (ref 0.0–0.1)
BASOS PCT: 1 %
EOS PCT: 1 %
Eosinophils Absolute: 0.1 10*3/uL (ref 0.0–0.7)
HEMATOCRIT: 39.3 % (ref 36.0–46.0)
Hemoglobin: 13.1 g/dL (ref 12.0–15.0)
Lymphocytes Relative: 25 %
Lymphs Abs: 2.9 10*3/uL (ref 0.7–4.0)
MCH: 33.5 pg (ref 26.0–34.0)
MCHC: 33.3 g/dL (ref 30.0–36.0)
MCV: 100.5 fL — ABNORMAL HIGH (ref 78.0–100.0)
MONO ABS: 1 10*3/uL (ref 0.1–1.0)
Monocytes Relative: 9 %
NEUTROS ABS: 7.4 10*3/uL (ref 1.7–7.7)
Neutrophils Relative %: 64 %
PLATELETS: 318 10*3/uL (ref 150–400)
RBC: 3.91 MIL/uL (ref 3.87–5.11)
RDW: 13.4 % (ref 11.5–15.5)
WBC: 11.6 10*3/uL — ABNORMAL HIGH (ref 4.0–10.5)

## 2015-02-11 LAB — COMPREHENSIVE METABOLIC PANEL
ALBUMIN: 3.9 g/dL (ref 3.5–5.0)
ALT: 11 U/L — ABNORMAL LOW (ref 14–54)
AST: 16 U/L (ref 15–41)
Alkaline Phosphatase: 88 U/L (ref 38–126)
Anion gap: 8 (ref 5–15)
CHLORIDE: 105 mmol/L (ref 101–111)
CO2: 26 mmol/L (ref 22–32)
Calcium: 9 mg/dL (ref 8.9–10.3)
Creatinine, Ser: 0.71 mg/dL (ref 0.44–1.00)
GFR calc Af Amer: >60 mL/min (ref >60)
GFR calc non Af Amer: >60 mL/min (ref >60)
GLUCOSE: 116 mg/dL — AB (ref 65–99)
POTASSIUM: 3.7 mmol/L (ref 3.5–5.1)
Sodium: 139 mmol/L (ref 135–145)
Total Bilirubin: 0.8 mg/dL (ref 0.3–1.2)
Total Protein: 7.4 g/dL (ref 6.5–8.1)

## 2015-02-11 LAB — IRON AND TIBC
Iron: 169 ug/dL (ref 28–170)
SATURATION RATIOS: 52 % — AB (ref 10.4–31.8)
TIBC: 326 ug/dL (ref 250–450)
UIBC: 157 ug/dL

## 2015-02-11 LAB — RETICULOCYTES
RBC.: 3.91 MIL/uL (ref 3.87–5.11)
Retic Count, Absolute: 70.4 10*3/uL (ref 19.0–186.0)
Retic Ct Pct: 1.8 % (ref 0.4–3.1)

## 2015-02-11 LAB — FOLATE: Folate: 15.7 ng/mL (ref >5.9)

## 2015-02-11 LAB — VITAMIN B12: Vitamin B-12: 624 pg/mL (ref 180–914)

## 2015-02-11 LAB — FERRITIN: FERRITIN: 69 ng/mL (ref 11–307)

## 2015-02-11 LAB — ANTITHROMBIN III: ANTITHROMB III FUNC: 76 % (ref 75–120)

## 2015-02-11 LAB — LACTATE DEHYDROGENASE: LDH: 120 U/L (ref 98–192)

## 2015-02-11 MED ORDER — APIXABAN 5 MG PO TABS: 5.0000 mg | ORAL_TABLET | Freq: Two times a day (BID) | ORAL | Status: AC

## 2015-02-11 MED ORDER — APIXABAN 5 MG PO TABS: 10.0000 mg | ORAL_TABLET | Freq: Two times a day (BID) | ORAL | Status: DC

## 2015-02-11 NOTE — Assessment & Plan Note (Signed)
Normocytic, normochromic anemia, minimal at presentation in the ED on 01/14/2015 and progressive with rehydration of IV fluids.  Patient still menstruating.  LMC:02/04/2015  Labs: CBC diff, anemia panel, retic count, haptoglobin, LHD.
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
## 2015-02-11 NOTE — Patient Instructions (Addendum)
[facility] at [facility] [place] Discharge Instructions  RECOMMENDATIONS MADE BY THE CONSULTANT AND ANY TEST RESULTS WILL BE SENT TO YOUR REFERRING PHYSICIAN.      Exam completed by Jenita Seashore PA and Dr Galen Manila today. We specialize in hematology.  Your platelet count was low while you were in the hospital. (thrombocytopenia) Also you had a blood clot. (portal vein thrombosis) Also your hemoglobin was low while you were in the hospital. (anemia) We are going to do some blood work today.  Eliquis loading then maintenance  Return to see the doctor in 3-4 weeks with Dr Galen Manila  Please call the clinic if you have any questions or concerns       Thank you for choosing [facility] at [facility] to provide your oncology and hematology care.  To afford each patient quality time with our provider, please arrive at least 15 minutes before your scheduled appointment time.    You need to re-schedule your appointment should you arrive 10 or more minutes late.  We strive to give you quality time with our providers, and arriving late affects you and other patients whose appointments are after yours.  Also, if you no show three or more times for appointments you may be dismissed from the clinic at the providers discretion.     Again, thank you for choosing [facility].  Our hope is that these requests will decrease the amount of time that you wait before being seen by our physicians.       _____________________________________________________________  Should you have questions after your visit to [facility], please contact our office at [phone] between the hours of 8:30 a.m. and 4:30 p.m.  Voicemails left after 4:30 p.m. will not be returned until the following business day.  For prescription refill requests, have your pharmacy contact our office.     Anemia, Nonspecific Anemia is a condition in which the concentration of red  blood cells or hemoglobin in the blood is below normal. Hemoglobin is a substance in red blood cells that carries oxygen to the tissues of the body. Anemia results in not enough oxygen reaching these tissues.  CAUSES  Common causes of anemia include:   Excessive bleeding. Bleeding may be internal or external. This includes excessive bleeding from periods (in women) or from the intestine.   Poor nutrition.   Chronic kidney, thyroid, and liver disease.  Bone marrow disorders that decrease red blood cell production.  Cancer and treatments for cancer.  HIV, AIDS, and their treatments.  Spleen problems that increase red blood cell destruction.  Blood disorders.  Excess destruction of red blood cells due to infection, medicines, and autoimmune disorders. SIGNS AND SYMPTOMS   Minor weakness.   Dizziness.   Headache.  Palpitations.   Shortness of breath, especially with exercise.   Paleness.  Cold sensitivity.  Indigestion.  Nausea.  Difficulty sleeping.  Difficulty concentrating. Symptoms may occur suddenly or they may develop slowly.  DIAGNOSIS  Additional blood tests are often needed. These help your health care provider determine the best treatment. Your health care provider will check your stool for blood and look for other causes of blood loss.  TREATMENT  Treatment varies depending on the cause of the anemia. Treatment can include:   Supplements of iron, vitamin B12, or folic acid.   Hormone medicines.   A blood transfusion. This may be needed if blood loss is severe.   Hospitalization. This  may be needed if there is significant continual blood loss.   Dietary changes.  Spleen removal. HOME CARE INSTRUCTIONS Keep all follow-up appointments. It often takes many weeks to correct anemia, and having your health care provider check on your condition and your response to treatment is very important. SEEK IMMEDIATE MEDICAL CARE IF:   You develop  extreme weakness, shortness of breath, or chest pain.   You become dizzy or have trouble concentrating.  You develop heavy vaginal bleeding.   You develop a rash.   You have bloody or black, tarry stools.   You faint.   You vomit up blood.   You vomit repeatedly.   You have abdominal pain.  You have a fever or persistent symptoms for more than 2-3 days.   You have a fever and your symptoms suddenly get worse.   You are dehydrated.  MAKE SURE YOU:  Understand these instructions.  Will watch your condition.  Will get help right away if you are not doing well or get worse.   This information is not intended to replace advice given to you by your health care provider. Make sure you discuss any questions you have with your health care provider.   Document Released: 02/25/2004 Document Revised: 09/19/2012 Document Reviewed: 07/13/2012 Elsevier Interactive Patient Education 2016 [employer].    Thrombocytopenia Thrombocytopenia is a condition in which there is an abnormally small number of platelets in your blood. Platelets are also called thrombocytes. Platelets are needed for blood clotting. CAUSES Thrombocytopenia is caused by:   Decreased production of platelets. This can be caused by:  Aplastic anemia in which your bone marrow quits making blood cells.  Cancer in the bone marrow.  Use of certain medicines, including chemotherapy.  Infection in the bone marrow.  Heavy alcohol consumption.  Increased destruction of platelets. This can be caused by:  Certain immune diseases.  Use of certain drugs.  Certain blood clotting disorders.  Certain inherited disorders.  Certain bleeding disorders.  Pregnancy.  Having an enlarged spleen (hypersplenism). In hypersplenism, the spleen gathers up platelets from circulation. This means the platelets are not available to help with blood clotting. The spleen can enlarge due to cirrhosis or other  conditions. SYMPTOMS  The symptoms of thrombocytopenia are side effects of poor blood clotting. Some of these are:  Abnormal bleeding.  Nosebleeds.  Heavy menstrual periods.  Blood in the urine or stools.  Purpura. This is a purplish discoloration in the skin produced by small bleeding vessels near the surface of the skin.  Bruising.  A rash that may be petechial. This looks like pinpoint, purplish-red spots on the skin and mucous membranes. It is caused by bleeding from small blood vessels (capillaries). DIAGNOSIS  Your caregiver will make this diagnosis based on your exam and blood tests. Sometimes, a bone marrow study is done to look for the original cells (megakaryocytes) that make platelets. TREATMENT  Treatment depends on the cause of the condition.  Medicines may be given to help protect your platelets from being destroyed.  In some cases, a replacement (transfusion) of platelets may be required to stop or prevent bleeding.  Sometimes, the spleen must be surgically removed. HOME CARE INSTRUCTIONS   Check the skin and linings inside your mouth for bruising or bleeding as directed by your caregiver.  Check your sputum, urine, and stool for blood as directed by your caregiver.  Do not return to any activities that could cause bumps or bruises until your caregiver says it  is okay.  Take extra care not to cut yourself when shaving or when using scissors, needles, knives, and other tools.  Take extra care not to burn yourself when ironing or cooking.  Ask your caregiver if it is okay for you to drink alcohol.  Only take over-the-counter or prescription medicines as directed by your caregiver.  Notify all your caregivers, including dentists and eye doctors, about your condition. SEEK IMMEDIATE MEDICAL CARE IF:   You develop active bleeding from anywhere in your body.  You develop unexplained bruising or bleeding.  You have blood in your sputum, urine, or stool. MAKE  SURE YOU:  Understand these instructions.  Will watch your condition.  Will get help right away if you are not doing well or get worse.   This information is not intended to replace advice given to you by your health care provider. Make sure you discuss any questions you have with your health care provider.   Document Released: 01/17/2005 Document Revised: 04/11/2011 Document Reviewed: 07/21/2014 Elsevier Interactive Patient Education [employer]2016 Elsevier Inc.

## 2015-02-11 NOTE — Assessment & Plan Note (Addendum)
Thrombocytopenia, etiology unknown, limited data.  She reported to the ED on 01/14/2015 with a platelet count of 54,000, her platelet count nadir was on 01/16/2015 at 28,000 with a platelet count recover on 01/19/2015 at time of discharge of 121,000.  Could be reactive from her acute illness.  Recovery over 72 hours to near normal value is promising.  I personally reviewed and went over radiographic studies with the patient.  The results are noted within this dictation.  I reviewed the CT report with Dr. Tyron RussellBoles.  There is mention in dictations about possible cirrhosis.  From an imaging standpoint, I seem a smooth liver edge and no clear evidence for cirrhosis.  Dr. Tyron RussellBoles confirms and notes some findings on CT indicative of her receiving a significant amount of fluids prior to the study, which is true based upon her chart review.  Negative hepatitis panel.  Known alcohol abuse, patient reported 6 pack of beer per weekend.  Will update labs today: CBC diff, CMET, HIV antibody

## 2015-02-11 NOTE — Assessment & Plan Note (Signed)
Portal vein thrombosis found on US imaging on 01/15/2015 in the setting of acute cholecystitis and significant thrombocytopenia.  Currently asymptomatic.  Started on Eliquis during hospitalization and given a 2 week supply free.  She completed her 2 week supply on 01/28/2015.  She has not taken the medication since that time due to insurance not being available until 02/02/2015 and in the interim learning of commercials regarding the medication.  She is provided education regarding the role of anticoagulation in preventing obstruction of blood flow and helping with resolution of the thrombus.   She is educated on the risks, benefits, and adverse reactions regarding all anticoagulant options.  Direct Xa inhibitors are thought to be safer than Vitamin K antagonists in many aspects.  She is agreeable to restart Eliquis.  She will need re-loaded.  New Rx's printed today:  Eliquis 10 mg PO BID x 7 days  Eliquis 5 mg PO BID x 6 months.  Labs today: Hypercoag panel today.  Return in 3-4 week for follow-up

## 2015-02-12 ENCOUNTER — Telehealth (HOSPITAL_COMMUNITY): Payer: Self-pay | Admitting: Emergency Medicine

## 2015-02-12 LAB — HOMOCYSTEINE: Homocysteine: 10.2 umol/L (ref 0.0–15.0)

## 2015-02-12 LAB — HAPTOGLOBIN: HAPTOGLOBIN: 181 mg/dL (ref 34–200)

## 2015-02-12 LAB — PROTEIN S, TOTAL: PROTEIN S AG TOTAL: 97 % (ref 60–150)

## 2015-02-12 LAB — HIV ANTIBODY (ROUTINE TESTING W REFLEX): HIV SCREEN 4TH GENERATION: NONREACTIVE

## 2015-02-12 LAB — PROTEIN S ACTIVITY: Protein S Activity: 85 % (ref 63–140)

## 2015-02-12 LAB — PROTEIN C ACTIVITY: Protein C Activity: 76 % (ref 73–180)

## 2015-02-12 NOTE — Telephone Encounter (Signed)
-----   Message from Ellouise Newerhomas S Kefalas, PA-C sent at 02/12/2015  4:00 PM EST ----- A lot of labs are still pending.  Let her know that so far labs are looking good.  We will review them in much greater detail when she returns.  If something comes back abnormal, we will let her know.

## 2015-02-12 NOTE — Telephone Encounter (Signed)
Notified pt that lab work that we have received so far was normal, we would discuss results further in detail when she comes in for her appt, verbalizes understanding

## 2015-02-13 LAB — BETA-2-GLYCOPROTEIN I ABS, IGG/M/A: Beta-2-Glycoprotein I IgM: <9 GPI IgM units (ref 0–32)

## 2015-02-13 LAB — PROTEIN C, TOTAL: PROTEIN C, TOTAL: 60 % (ref 60–150)

## 2015-02-13 LAB — LUPUS ANTICOAGULANT PANEL
DRVVT: 42 s (ref 0.0–44.0)
PTT Lupus Anticoagulant: 37.9 s (ref 0.0–40.6)

## 2015-02-13 LAB — CARDIOLIPIN ANTIBODIES, IGG, IGM, IGA
ANTICARDIOLIPIN IGM: 21 [MPL'U]/mL — AB (ref 0–12)
Anticardiolipin IgA: <9 APL U/mL (ref 0–11)

## 2015-02-16 LAB — PROTHROMBIN GENE MUTATION

## 2015-02-16 LAB — FACTOR 5 LEIDEN

## 2015-03-05 ENCOUNTER — Ambulatory Visit (HOSPITAL_COMMUNITY): Payer: Self-pay | Admitting: Hematology & Oncology

## 2015-03-17 ENCOUNTER — Ambulatory Visit (HOSPITAL_COMMUNITY): Payer: Self-pay | Admitting: Hematology & Oncology

## 2015-03-25 ENCOUNTER — Encounter (HOSPITAL_COMMUNITY): Payer: Managed Care, Other (non HMO) | Attending: Oncology | Admitting: Oncology

## 2015-03-25 ENCOUNTER — Encounter (HOSPITAL_COMMUNITY): Payer: Self-pay | Admitting: Oncology

## 2015-03-25 VITALS — BP 127/72 | HR 81 | Temp 98.5°F | Resp 18 | Wt 96.8 lb

## 2015-03-25 DIAGNOSIS — D696 Thrombocytopenia, unspecified: Secondary | ICD-10-CM | POA: Diagnosis not present

## 2015-03-25 DIAGNOSIS — I81 Portal vein thrombosis: Secondary | ICD-10-CM | POA: Diagnosis not present

## 2015-03-25 DIAGNOSIS — M545 Low back pain, unspecified: Secondary | ICD-10-CM

## 2015-03-25 DIAGNOSIS — D649 Anemia, unspecified: Secondary | ICD-10-CM | POA: Diagnosis not present

## 2015-03-25 MED ORDER — CYCLOBENZAPRINE HCL 10 MG PO TABS: 10.0000 mg | ORAL_TABLET | Freq: Three times a day (TID) | ORAL | Status: AC | PRN
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
## 2015-03-25 NOTE — Assessment & Plan Note (Signed)
Resolved

## 2015-03-25 NOTE — Patient Instructions (Signed)
[facility] at [facility] Discharge Instructions  RECOMMENDATIONS MADE BY THE CONSULTANT AND ANY TEST RESULTS WILL BE SENT TO YOUR REFERRING PHYSICIAN.  Exam and discussion today with Jenita Seashore, PA. Lab work in 6-8 weeks Office visit in 8 weeks. Flexeril prescription sent to your pharmacy. Tylenol 325 mg every 6 hours as needed for pain. Call the clinic in 1-2 weeks if your back pain is not improving.  Thank you for choosing [facility] at [facility] to provide your oncology and hematology care.  To afford each patient quality time with our provider, please arrive at least 15 minutes before your scheduled appointment time.   Beginning January 23rd 2017 lab work for the [facility] will be done in the  Main lab at [facility] on 1st floor. If you have a lab appointment with the Cancer Center please come in thru the  Main Entrance and check in at the main information desk  You need to re-schedule your appointment should you arrive 10 or more minutes late.  We strive to give you quality time with our providers, and arriving late affects you and other patients whose appointments are after yours.  Also, if you no show three or more times for appointments you may be dismissed from the clinic at the providers discretion.     Again, thank you for choosing [facility].  Our hope is that these requests will decrease the amount of time that you wait before being seen by our physicians.       _____________________________________________________________  Should you have questions after your visit to [facility], please contact our office at [phone] between the hours of 8:30 a.m. and 4:30 p.m.  Voicemails left after 4:30 p.m. will not be returned until the following business day.  For prescription refill requests, have your pharmacy contact our office.

## 2015-03-25 NOTE — Assessment & Plan Note (Addendum)
Portal vein thrombosis found on US imaging on 01/15/2015 in the setting of acute cholecystitis and sepsis.  Currently on Eliquis anticoagulation beginning on 02/12/2015.  She is provided education regarding the role of anticoagulation in preventing obstruction of blood flow and helping with resolution of the thrombus.   She is educated on the risks, benefits, and adverse reactions regarding all anticoagulant options.  Direct Xa inhibitors are thought to be safer than Vitamin K antagonists in many aspects.  Compliance with Eliquis is strongly encouraged.  I personally reviewed and went over laboratory results with the patient.  The results are noted within this dictation.  She is educated on her low-med positivity of anticardiolipin antibody IgM.  We will repeat lab test 3 months from its original check date.  She reports a fall about 1 month ago when she slipped getting out of her bathtub.  When she fell, she injured her left lower back.  Her physical exam findings were suspicious for a left muscle strain of low back, latissimus dorsi.  Given that she is on apixaban, NSAID treatment can increase the serum concentration of apixaban.  Therefore, I have recommended acetaminophen for pain control.  For muscle spasms, I have escribed Flexeril 10 mg TID PRN #30 with 1 refill.  She will let me know if this regimen does not improve her symptoms in 1-2 weeks.  I would consider a pulse of Prednisone for its anti-inflammatory effects at that time.    Labs in 6-7 weeks: CBC, anticardiolipin antibodies.  Return in ~ 8 weeks for follow-up.

## 2015-03-31 NOTE — Progress Notes (Signed)
This encounter was created in error - please disregard.

## 2015-04-04 NOTE — Progress Notes (Signed)
This encounter was created in error - please disregard.

## 2015-04-20 ENCOUNTER — Encounter (HOSPITAL_COMMUNITY): Payer: Self-pay | Admitting: Emergency Medicine

## 2015-04-20 ENCOUNTER — Emergency Department (HOSPITAL_COMMUNITY)
Admission: EM | Admit: 2015-04-20 | Discharge: 2015-04-21 | Disposition: A | Discharge status: Home / Self Care | Payer: Managed Care, Other (non HMO) | Attending: Emergency Medicine | Admitting: Emergency Medicine

## 2015-04-20 ENCOUNTER — Emergency Department (HOSPITAL_COMMUNITY): Payer: Managed Care, Other (non HMO)

## 2015-04-20 DIAGNOSIS — Z79899 Other long term (current) drug therapy: Secondary | ICD-10-CM | POA: Insufficient documentation

## 2015-04-20 DIAGNOSIS — R197 Diarrhea, unspecified: Secondary | ICD-10-CM | POA: Diagnosis present

## 2015-04-20 DIAGNOSIS — F17210 Nicotine dependence, cigarettes, uncomplicated: Secondary | ICD-10-CM | POA: Insufficient documentation

## 2015-04-20 DIAGNOSIS — R509 Fever, unspecified: Secondary | ICD-10-CM

## 2015-04-20 DIAGNOSIS — N390 Urinary tract infection, site not specified: Secondary | ICD-10-CM

## 2015-04-20 LAB — COMPREHENSIVE METABOLIC PANEL
ALK PHOS: 84 U/L (ref 38–126)
ALT: 15 U/L (ref 14–54)
AST: 28 U/L (ref 15–41)
Albumin: 3.6 g/dL (ref 3.5–5.0)
Anion gap: 7 (ref 5–15)
CALCIUM: 8.1 mg/dL — AB (ref 8.9–10.3)
CHLORIDE: 102 mmol/L (ref 101–111)
CO2: 23 mmol/L (ref 22–32)
CREATININE: 0.72 mg/dL (ref 0.44–1.00)
GFR calc non Af Amer: >60 mL/min (ref >60)
Glucose, Bld: 116 mg/dL — ABNORMAL HIGH (ref 65–99)
Potassium: 3.4 mmol/L — ABNORMAL LOW (ref 3.5–5.1)
SODIUM: 132 mmol/L — AB (ref 135–145)
Total Bilirubin: 0.5 mg/dL (ref 0.3–1.2)
Total Protein: 6.9 g/dL (ref 6.5–8.1)

## 2015-04-20 LAB — CBC WITH DIFFERENTIAL/PLATELET
BASOS ABS: 0.1 10*3/uL (ref 0.0–0.1)
Basophils Relative: 1 %
EOS ABS: 0 10*3/uL (ref 0.0–0.7)
Eosinophils Relative: 0 %
HCT: 40.4 % (ref 36.0–46.0)
HEMOGLOBIN: 14 g/dL (ref 12.0–15.0)
LYMPHS ABS: 1.2 10*3/uL (ref 0.7–4.0)
LYMPHS PCT: 21 %
MCH: 34.2 pg — AB (ref 26.0–34.0)
MCHC: 34.7 g/dL (ref 30.0–36.0)
MCV: 98.8 fL (ref 78.0–100.0)
Monocytes Absolute: 1.1 10*3/uL — ABNORMAL HIGH (ref 0.1–1.0)
Monocytes Relative: 20 %
NEUTROS PCT: 58 %
Neutro Abs: 3.2 10*3/uL (ref 1.7–7.7)
Platelets: 259 10*3/uL (ref 150–400)
RBC: 4.09 MIL/uL (ref 3.87–5.11)
RDW: 12.6 % (ref 11.5–15.5)
WBC: 5.5 10*3/uL (ref 4.0–10.5)

## 2015-04-20 LAB — URINALYSIS, ROUTINE W REFLEX MICROSCOPIC
GLUCOSE, UA: NEGATIVE mg/dL
Ketones, ur: NEGATIVE mg/dL
Nitrite: POSITIVE — AB
PH: 5 (ref 5.0–8.0)
PROTEIN: NEGATIVE mg/dL
SPECIFIC GRAVITY, URINE: 1.01 (ref 1.005–1.030)

## 2015-04-20 LAB — PROTIME-INR
INR: 0.99 (ref 0.00–1.49)
Prothrombin Time: 13.3 seconds (ref 11.6–15.2)

## 2015-04-20 LAB — URINE MICROSCOPIC-ADD ON

## 2015-04-20 LAB — LIPASE, BLOOD: Lipase: 43 U/L (ref 11–51)

## 2015-04-20 LAB — PREGNANCY, URINE: Preg Test, Ur: NEGATIVE

## 2015-04-20 LAB — I-STAT CG4 LACTIC ACID, ED: Lactic Acid, Venous: 1.4 mmol/L (ref 0.5–2.0)

## 2015-04-20 MED ORDER — DEXTROSE 5 % IV SOLN
1.0000 g | Freq: Once | INTRAVENOUS | Status: AC
  Administered 2015-04-21: 1 g via INTRAVENOUS
  Filled 2015-04-20: qty 10

## 2015-04-20 MED ORDER — SODIUM CHLORIDE 0.9 % IV BOLUS (SEPSIS)
1000.0000 mL | Freq: Once | INTRAVENOUS | Status: AC
  Administered 2015-04-20: 1000 mL via INTRAVENOUS

## 2015-04-20 MED ORDER — IOHEXOL 300 MG/ML  SOLN
100.0000 mL | Freq: Once | INTRAMUSCULAR | Status: AC | PRN
  Administered 2015-04-20: 100 mL via INTRAVENOUS

## 2015-04-20 MED ORDER — DIATRIZOATE MEGLUMINE & SODIUM 66-10 % PO SOLN
ORAL | Status: AC
  Administered 2015-04-20: 21:00:00
  Filled 2015-04-20: qty 30

## 2015-04-20 NOTE — ED Notes (Signed)
Call to lab re: urine and bld cultures

## 2015-04-20 NOTE — ED Notes (Signed)
Pt states she started having diarrhea fever, chills yesterday.  States "this is the same as when I had a gallbladder infection last year"
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
## 2015-04-21 MED ORDER — ONDANSETRON HCL 4 MG PO TABS: 4.0000 mg | ORAL_TABLET | Freq: Four times a day (QID) | ORAL | Status: AC

## 2015-04-21 MED ORDER — CEPHALEXIN 500 MG PO CAPS: 500.0000 mg | ORAL_CAPSULE | Freq: Four times a day (QID) | ORAL | Status: AC

## 2015-04-21 NOTE — Discharge Instructions (Signed)
Urinary Tract Infection Take the antibiotics as prescribed. Follow up with your doctor. Return to the ED if you develop new or worsening symptoms. Urinary tract infections (UTIs) can develop anywhere along your urinary tract. Your urinary tract is your body's drainage system for removing wastes and extra water. Your urinary tract includes two kidneys, two ureters, a bladder, and a urethra. Your kidneys are a pair of bean-shaped organs. Each kidney is about the size of your fist. They are located below your ribs, one on each side of your spine. CAUSES Infections are caused by microbes, which are microscopic organisms, including fungi, viruses, and bacteria. These organisms are so small that they can only be seen through a microscope. Bacteria are the microbes that most commonly cause UTIs. SYMPTOMS  Symptoms of UTIs may vary by age and gender of the patient and by the location of the infection. Symptoms in young women typically include a frequent and intense urge to urinate and a painful, burning feeling in the bladder or urethra during urination. Older women and men are more likely to be tired, shaky, and weak and have muscle aches and abdominal pain. A fever may mean the infection is in your kidneys. Other symptoms of a kidney infection include pain in your back or sides below the ribs, nausea, and vomiting. DIAGNOSIS To diagnose a UTI, your caregiver will ask you about your symptoms. Your caregiver will also ask you to provide a urine sample. The urine sample will be tested for bacteria and white blood cells. White blood cells are made by your body to help fight infection. TREATMENT  Typically, UTIs can be treated with medication. Because most UTIs are caused by a bacterial infection, they usually can be treated with the use of antibiotics. The choice of antibiotic and length of treatment depend on your symptoms and the type of bacteria causing your infection. HOME CARE INSTRUCTIONS  If you were  prescribed antibiotics, take them exactly as your caregiver instructs you. Finish the medication even if you feel better after you have only taken some of the medication.  Drink enough water and fluids to keep your urine clear or pale yellow.  Avoid caffeine, tea, and carbonated beverages. They tend to irritate your bladder.  Empty your bladder often. Avoid holding urine for long periods of time.  Empty your bladder before and after sexual intercourse.  After a bowel movement, women should cleanse from front to back. Use each tissue only once. SEEK MEDICAL CARE IF:   You have back pain.  You develop a fever.  Your symptoms do not begin to resolve within 3 days. SEEK IMMEDIATE MEDICAL CARE IF:   You have severe back pain or lower abdominal pain.  You develop chills.  You have nausea or vomiting.  You have continued burning or discomfort with urination. MAKE SURE YOU:   Understand these instructions.  Will watch your condition.  Will get help right away if you are not doing well or get worse.   This information is not intended to replace advice given to you by your health care provider. Make sure you discuss any questions you have with your health care provider.   Document Released: 10/27/2004 Document Revised: 10/08/2014 Document Reviewed: 02/25/2011 Elsevier Interactive Patient Education [employer]

## 2015-04-22 LAB — URINE CULTURE

## 2015-04-25 LAB — CULTURE, BLOOD (ROUTINE X 2)
CULTURE: NO GROWTH
Culture: NO GROWTH

## 2015-05-14 ENCOUNTER — Encounter (HOSPITAL_COMMUNITY): Payer: Managed Care, Other (non HMO) | Attending: Oncology

## 2015-05-14 DIAGNOSIS — D649 Anemia, unspecified: Secondary | ICD-10-CM | POA: Insufficient documentation

## 2015-05-14 DIAGNOSIS — D696 Thrombocytopenia, unspecified: Secondary | ICD-10-CM | POA: Insufficient documentation

## 2015-05-14 DIAGNOSIS — I81 Portal vein thrombosis: Secondary | ICD-10-CM | POA: Insufficient documentation

## 2015-05-14 LAB — CBC WITH DIFFERENTIAL/PLATELET
BASOS ABS: 0.1 10*3/uL (ref 0.0–0.1)
BASOS PCT: 1 %
EOS PCT: 2 %
Eosinophils Absolute: 0.2 10*3/uL (ref 0.0–0.7)
HCT: 37.3 % (ref 36.0–46.0)
Hemoglobin: 12.7 g/dL (ref 12.0–15.0)
Lymphocytes Relative: 24 %
Lymphs Abs: 1.8 10*3/uL (ref 0.7–4.0)
MCH: 33.9 pg (ref 26.0–34.0)
MCHC: 34 g/dL (ref 30.0–36.0)
MCV: 99.5 fL (ref 78.0–100.0)
MONO ABS: 0.7 10*3/uL (ref 0.1–1.0)
Monocytes Relative: 9 %
NEUTROS ABS: 4.9 10*3/uL (ref 1.7–7.7)
Neutrophils Relative %: 64 %
PLATELETS: 294 10*3/uL (ref 150–400)
RBC: 3.75 MIL/uL — ABNORMAL LOW (ref 3.87–5.11)
RDW: 13.4 % (ref 11.5–15.5)
WBC: 7.6 10*3/uL (ref 4.0–10.5)

## 2015-05-16 LAB — CARDIOLIPIN ANTIBODIES, IGG, IGM, IGA
Anticardiolipin IgA: <9 APL U/mL (ref 0–11)
Anticardiolipin IgG: <9 GPL U/mL (ref 0–14)
Anticardiolipin IgM: 27 MPL U/mL — ABNORMAL HIGH (ref 0–12)

## 2015-05-22 ENCOUNTER — Ambulatory Visit (HOSPITAL_COMMUNITY): Payer: Managed Care, Other (non HMO) | Admitting: Hematology & Oncology

## 2015-06-16 ENCOUNTER — Ambulatory Visit (HOSPITAL_COMMUNITY): Payer: Managed Care, Other (non HMO) | Admitting: Hematology & Oncology

## 2015-06-21 NOTE — Progress Notes (Signed)
This encounter was created in error - please disregard.

## 2015-07-10 ENCOUNTER — Ambulatory Visit (HOSPITAL_COMMUNITY): Payer: Managed Care, Other (non HMO) | Admitting: Hematology & Oncology

## 2015-07-13 NOTE — Progress Notes (Signed)
This encounter was created in error - please disregard.

## 2016-08-26 IMAGING — US US ABDOMEN COMPLETE
1 series · 13 of 25 positions shown · non-contrast
Comparison: CT scan of the abdomen and pelvis dated January 14, 2015

CLINICAL DATA: Clinical diagnosis of acute cholecystitis, abnormal
CT scan of the abdomen and pelvis yesterday

EXAM:
ULTRASOUND ABDOMEN COMPLETE

[Series 1: us abdomen complete · 0.20mm/px · 13 of 69 slices shown]
[im 1/69]
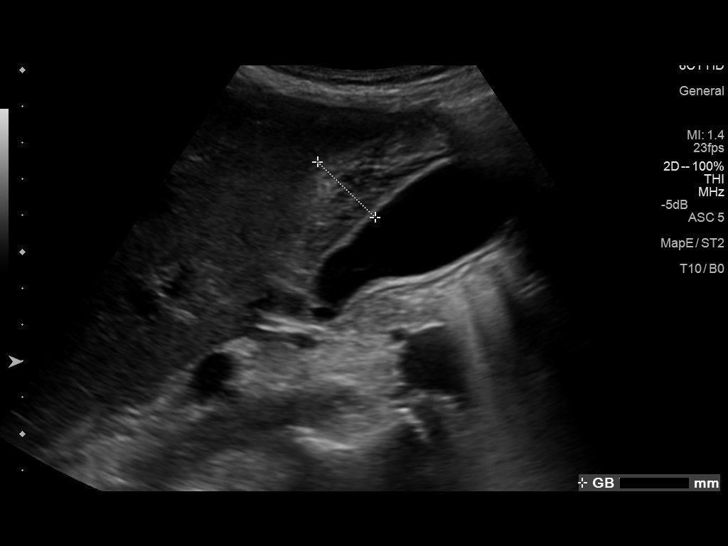
[im 6/69]
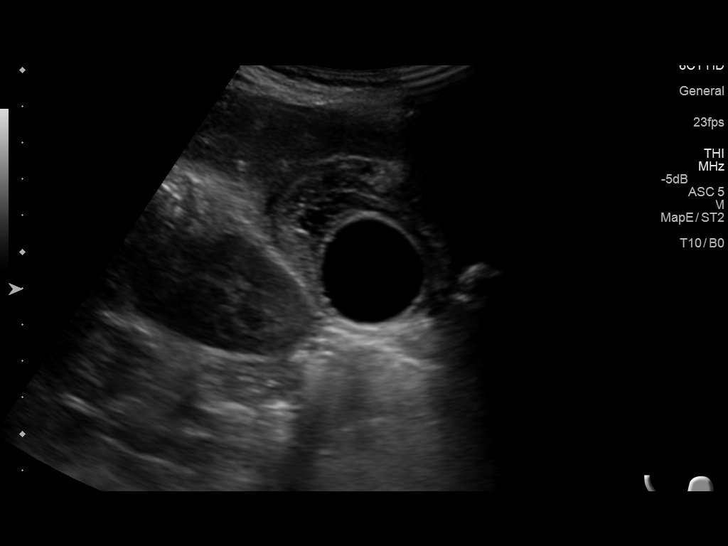
[im 12/69]
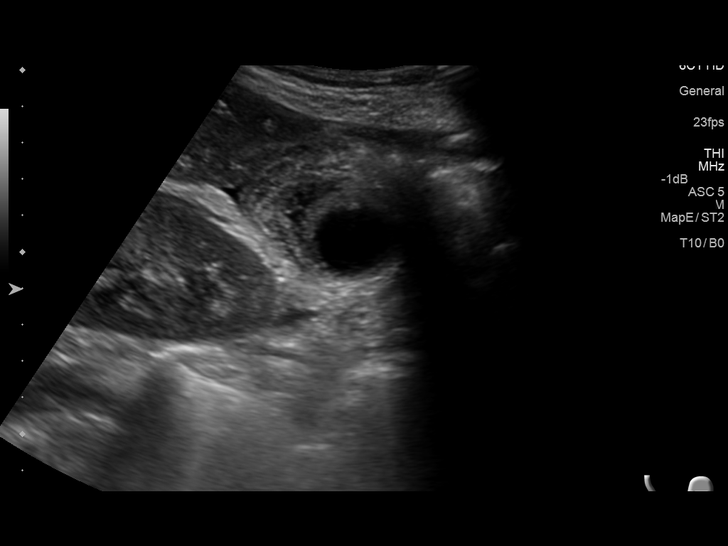
[im 18/69]
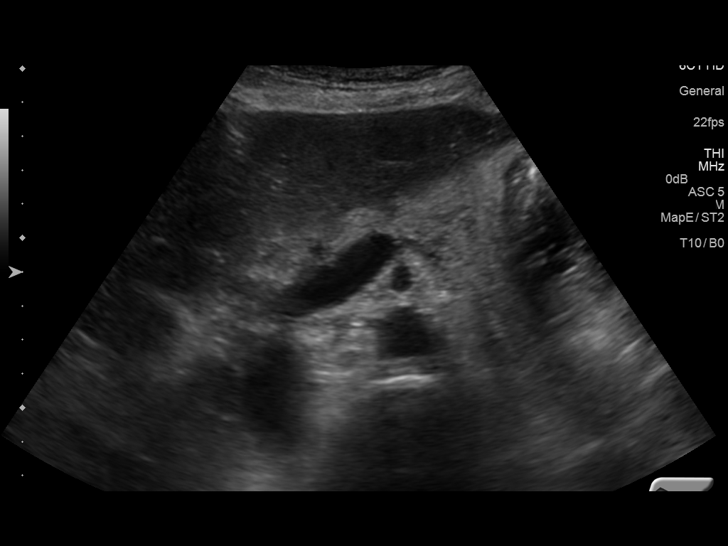
[im 23/69]
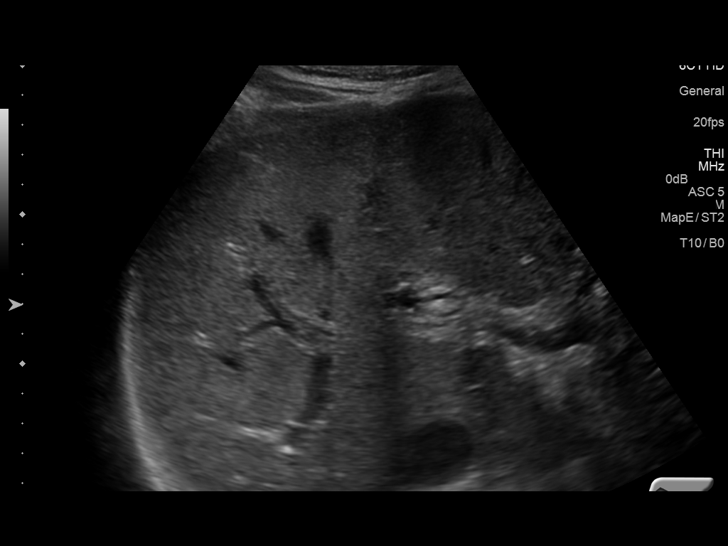
[im 29/69]
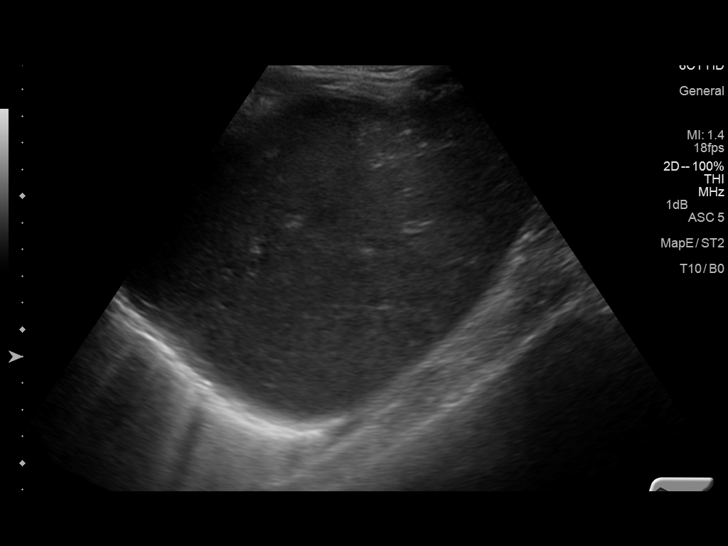
[im 35/69]
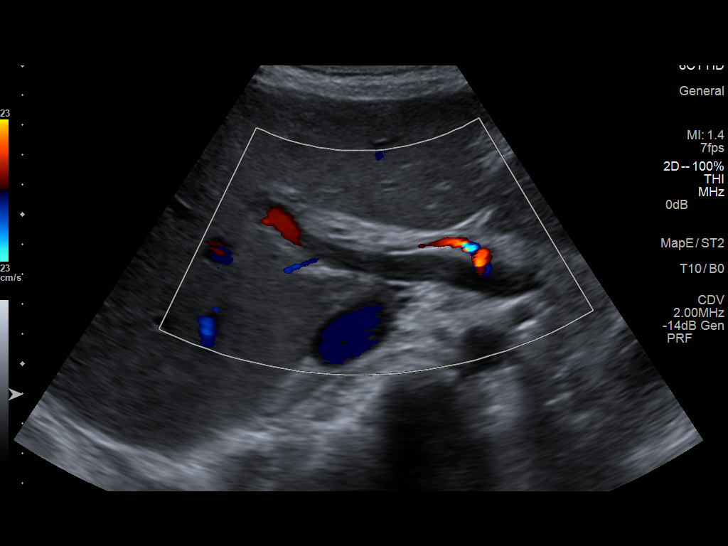
[im 40/69]
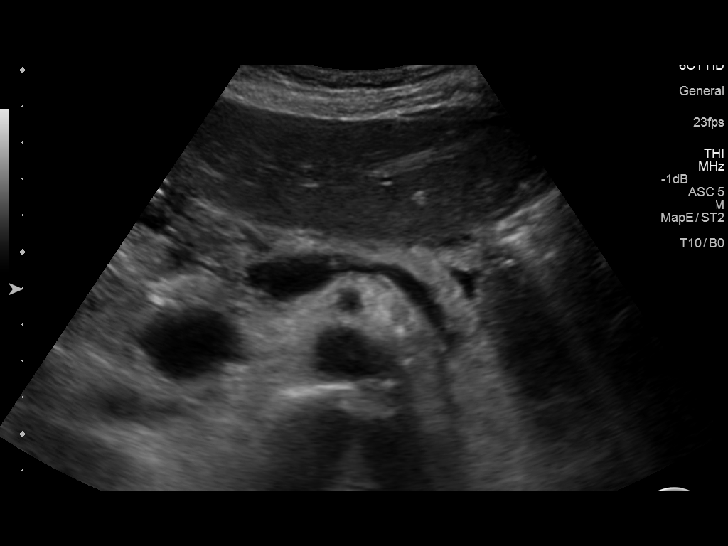
[im 46/69]
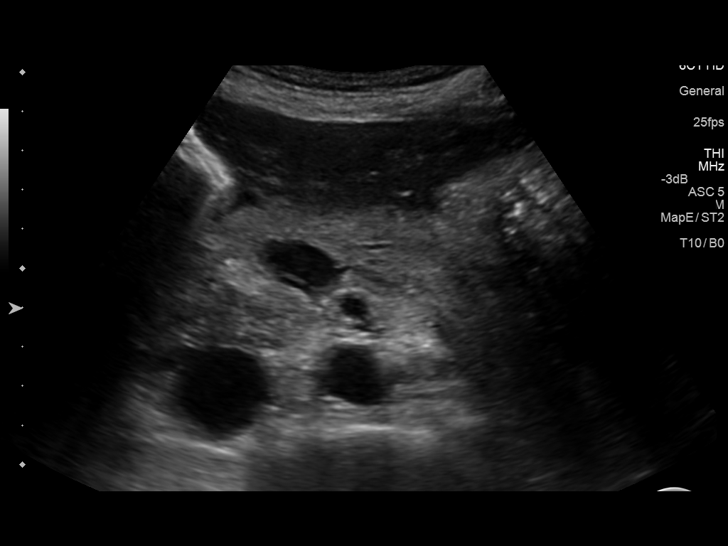
[im 52/69]
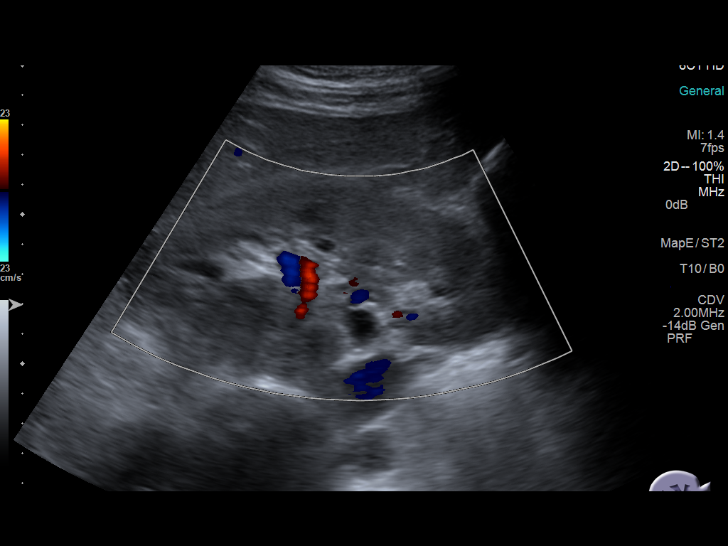
[im 57/69]
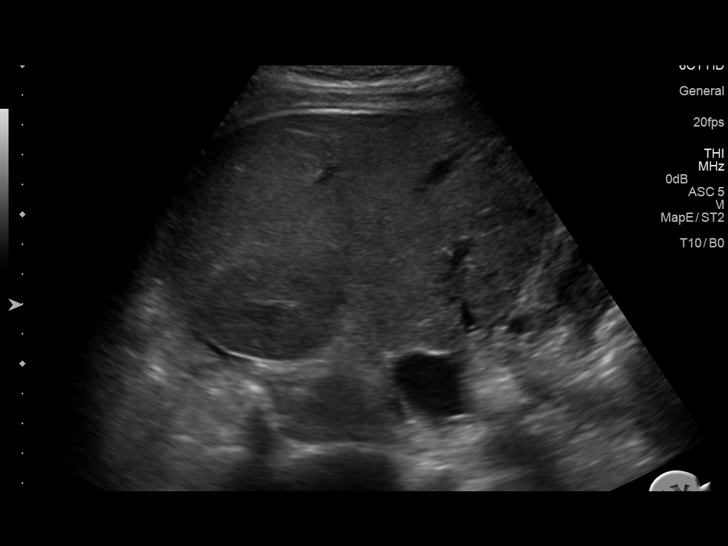
[im 63/69]
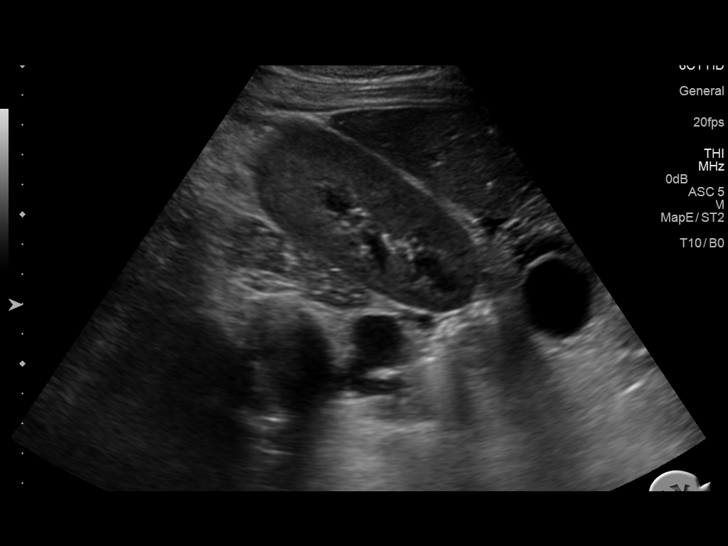
[im 69/69]
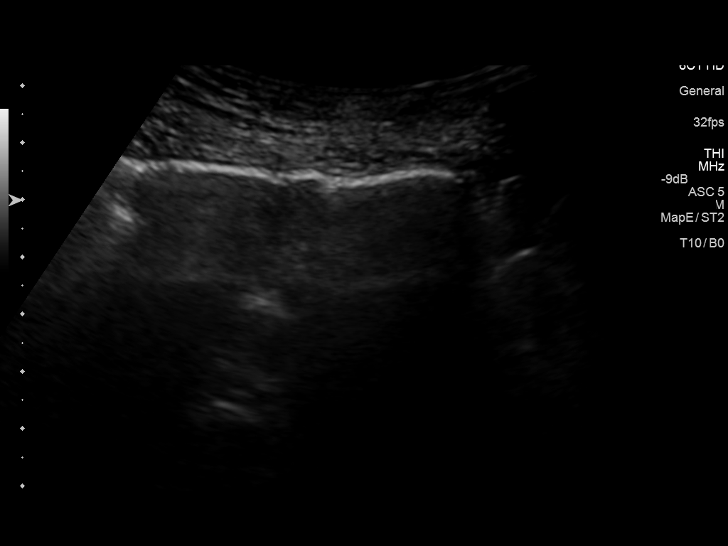

[13 of 25 positions shown; findings below may reference images not displayed]

FINDINGS: Gallbladder: The gallbladder is adequately distended with no
evidence of stones or sludge. There is no gallbladder wall
thickening. There is pericholecystic fluid and there is a positive
sonographic Murphy's sign.

Common bile duct: Diameter: 2.4 mm

Liver: The hepatic echotexture is mildly heterogeneous. There is no
focal mass nor ductal dilation. The portal vein appears thrombosed.
There is very hepatic ascites

IVC: No abnormality visualized.

Pancreas: There is a hyperechoic focus between the head and body of
the pancreas measuring 1.1 x 1.2 x 1.3 cm the pancreatic head and
body and tail are unremarkable.

Spleen: The spleen is surgically absent

Right Kidney: Length: 14.1 cm. Echogenicity within normal limits. No
mass or hydronephrosis visualized.

Left Kidney: Length: The left kidney is surgically absent.

Abdominal aorta: No aneurysm visualized. The distal aorta was
obscured by bowel gas.

Other findings: None.
IMPRESSION: 1. Pericholecystic fluid and positive sonographic Murphy's sign
without evidence of gallstones. Findings are consistent with acute
cholecystitis.
2. Abnormal appearance of the portal vein suspicious for thrombus.
There is perihepatic ascites. Possible lesion at the junction of the
pancreatic head and neck not clearly evident on the previous CT
scan.
3. These results will be called to the ordering clinician or
representative by the Radiologist Assistant, and communication
documented in the PACS or zVision Dashboard.

## 2017-01-16 IMAGING — CR DG CHEST 1V PORT
1 series · 1 of 1 positions shown · non-contrast
Comparison: January 14, 2015

CLINICAL DATA: Central catheter placement

EXAM:
PORTABLE CHEST 1 VIEW

[ap portable]
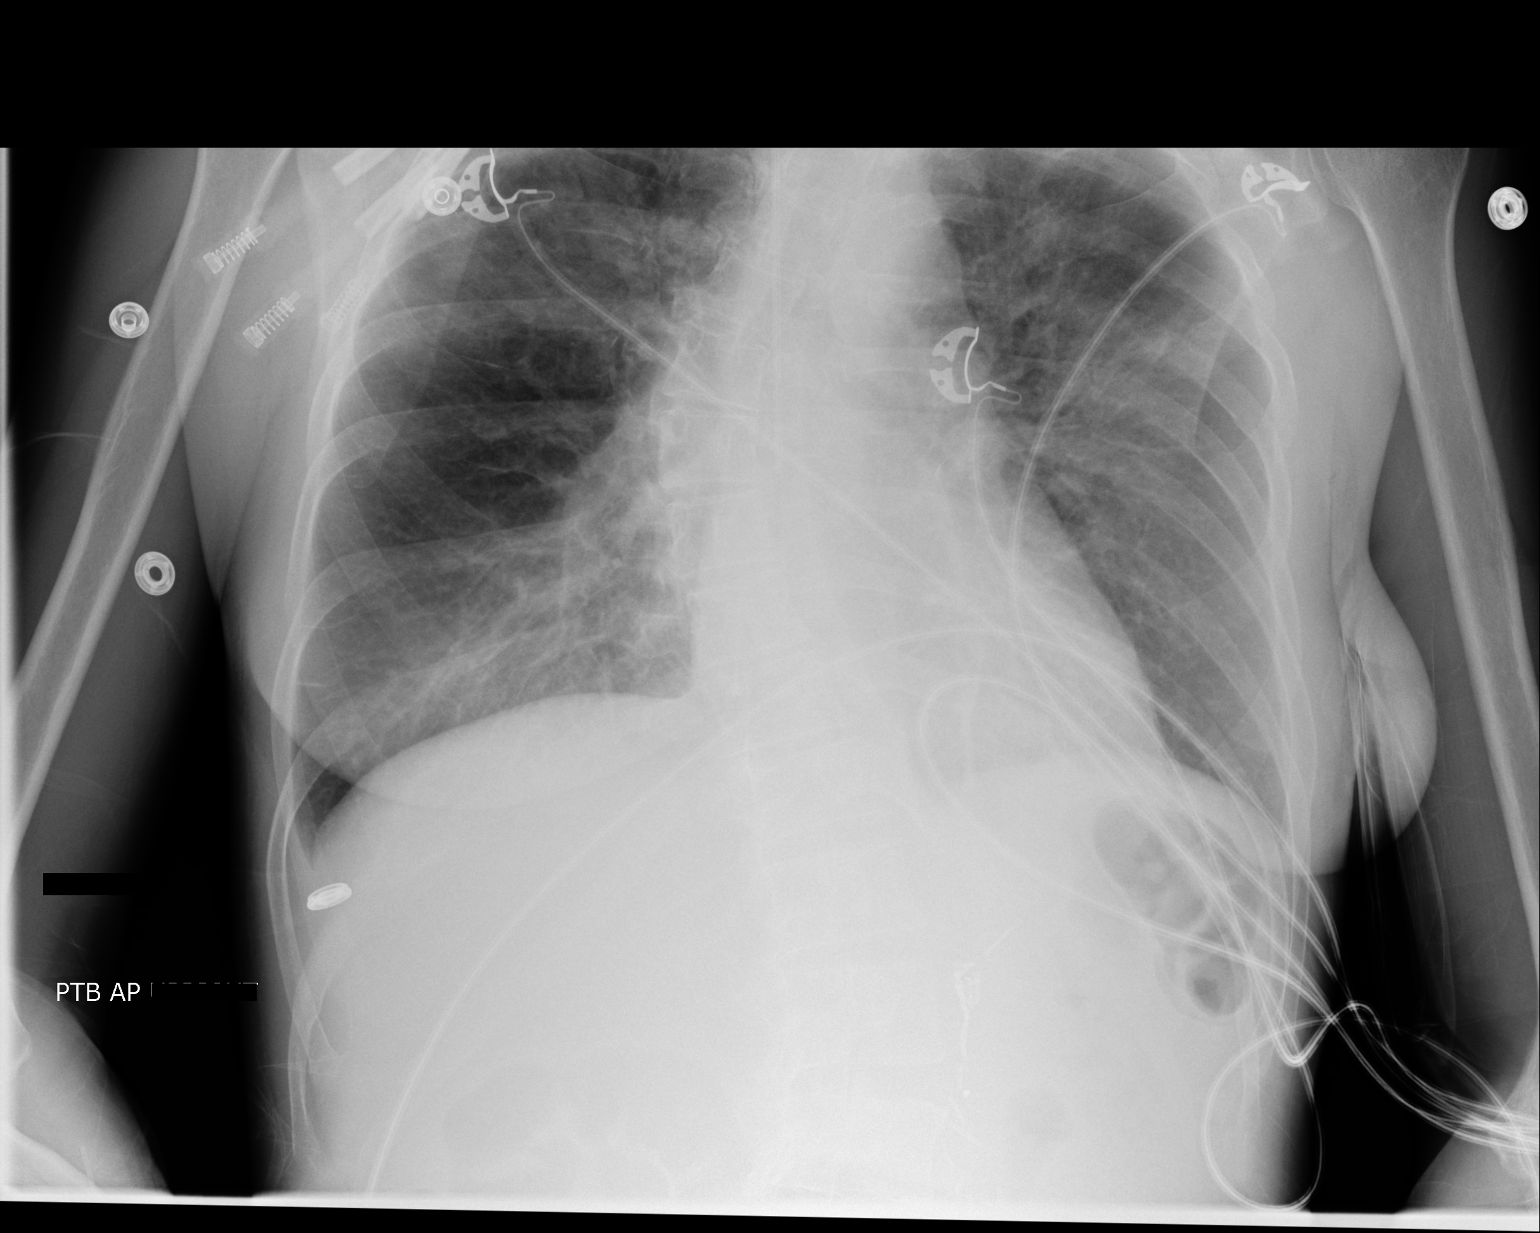

[1 of 1 positions shown; findings below may reference images not displayed]

FINDINGS: Central catheter tip is in the region of the superior vena cava
slightly proximal to the cavoatrial junction. No pneumothorax. There
is no edema or consolidation. The heart size and pulmonary
vascularity are normal. No adenopathy. There is mid thoracic
dextroscoliosis with thoracolumbar levoscoliosis. There are surgical
clips in the medial left upper abdomen region.
IMPRESSION: Central catheter tip in superior vena cava. No pneumothorax. No
edema or consolidation. Stable scoliosis.

## 2017-04-21 IMAGING — CT CT ABD-PELV W/ CM
2 of 4 series · 16 of 46 positions shown, 18 images · IV contrast (Omnipaque 300)
Comparison: 01/14/2015

CLINICAL DATA: Right lower quadrant and mid abdominal pain starting
today. Fever, diarrhea. History of gallstones.

EXAM:
CT ABDOMEN AND PELVIS WITH CONTRAST
TECHNIQUE: Multidetector CT imaging of the abdomen and pelvis was performed
using the standard protocol following bolus administration of
intravenous contrast.
CONTRAST:  100mL OMNIPAQUE IOHEXOL 300 MG/ML  SOLN

[Series 2: abd_pel_with 5.0 b40f · axial · 0.59mm/px · z∈[-478,-108]mm · 13 of 82 slices shown, 15 images]
[im 4/82  soft-tissue]
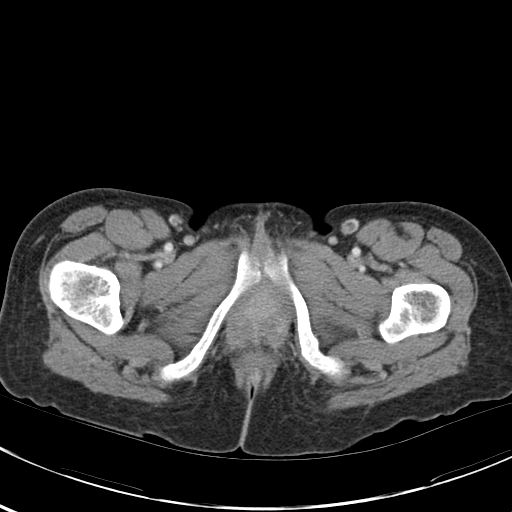
[im 4/82  bone]
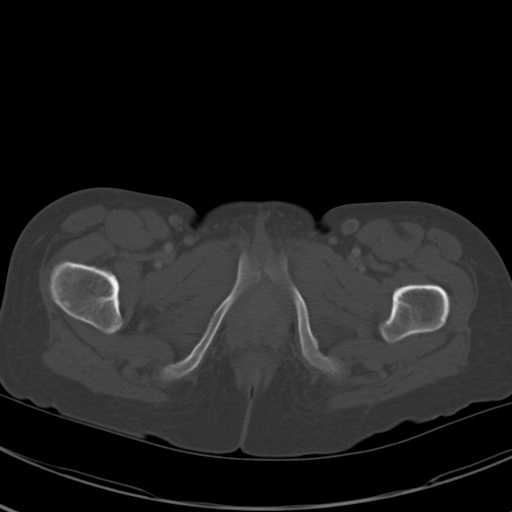
[im 12/82  soft-tissue]
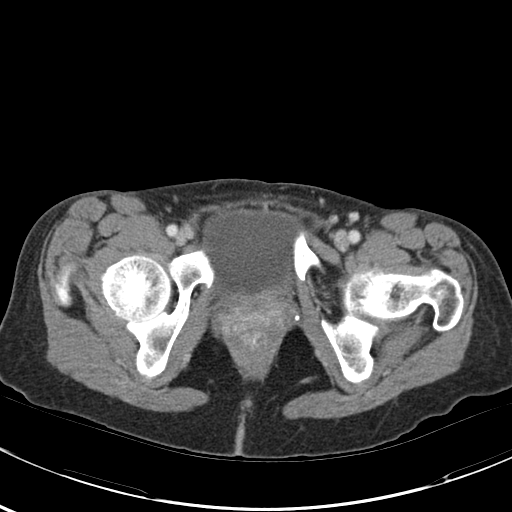
[im 16/82  soft-tissue]
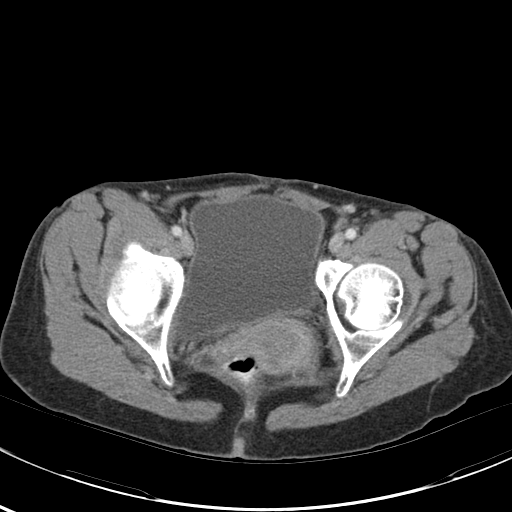
[im 24/82  soft-tissue]
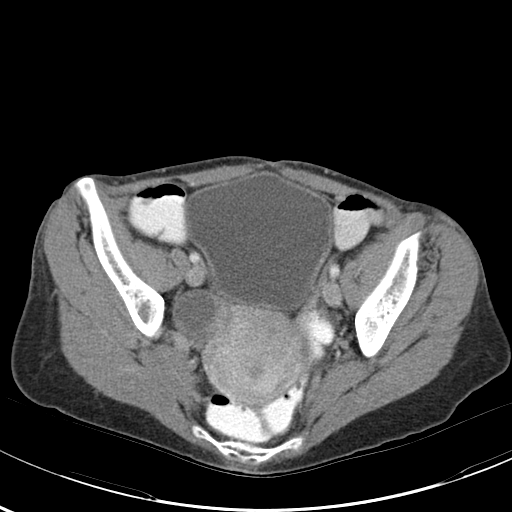
[im 28/82  soft-tissue]
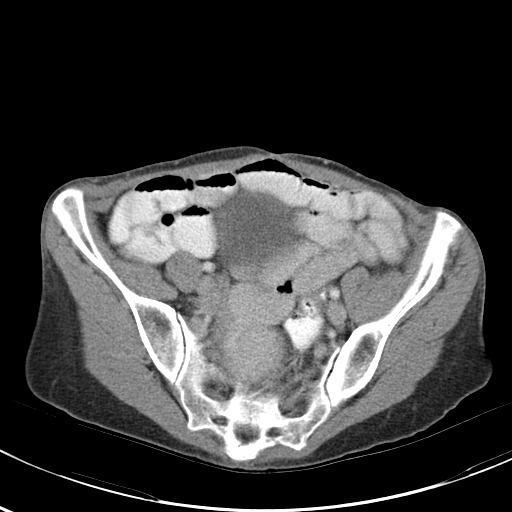
[im 35/82  soft-tissue]
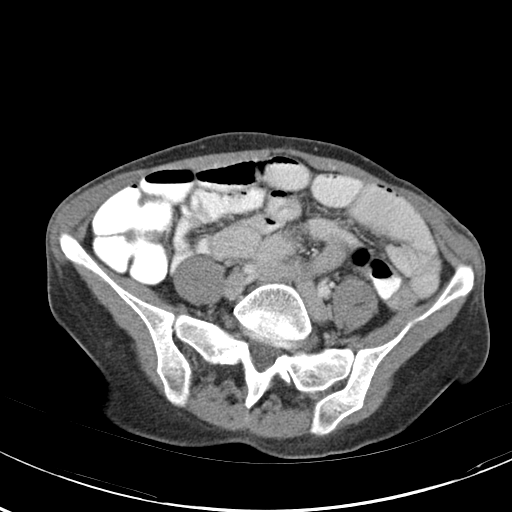
[im 43/82  soft-tissue]
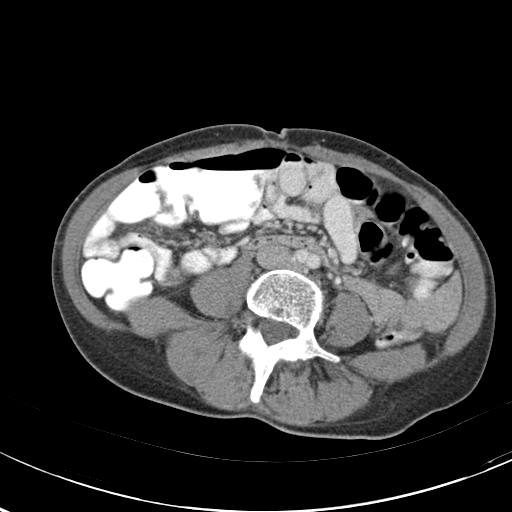
[im 47/82  soft-tissue]
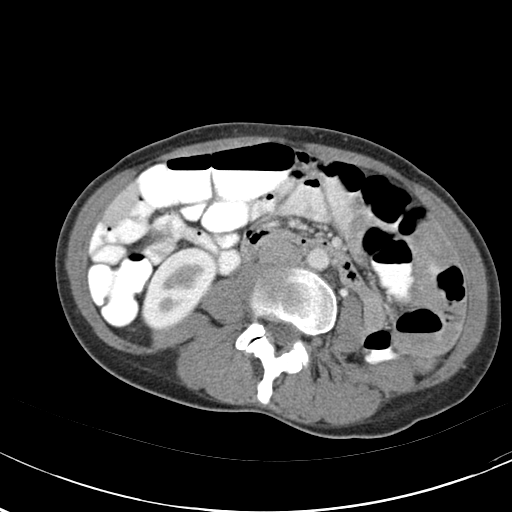
[im 55/82  soft-tissue]
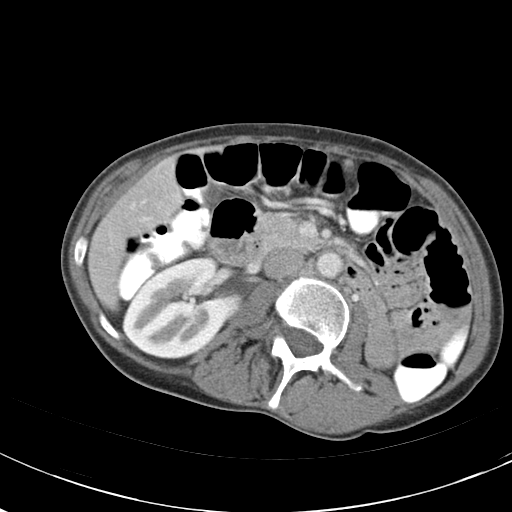
[im 55/82  bone]
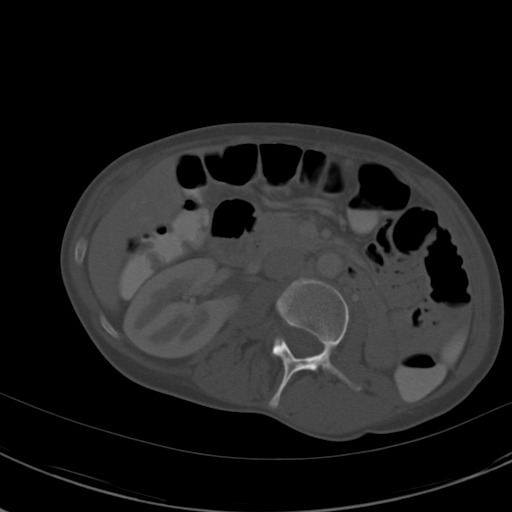
[im 58/82  soft-tissue]
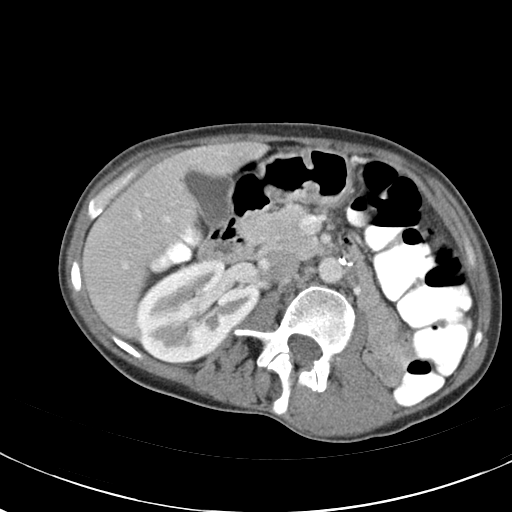
[im 66/82  soft-tissue]
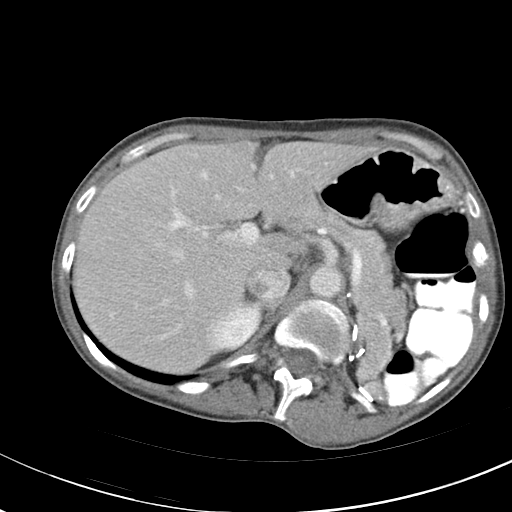
[im 70/82  soft-tissue]
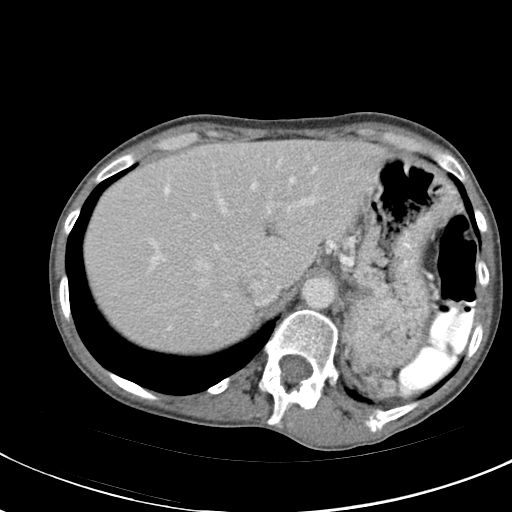
[im 78/82  soft-tissue]
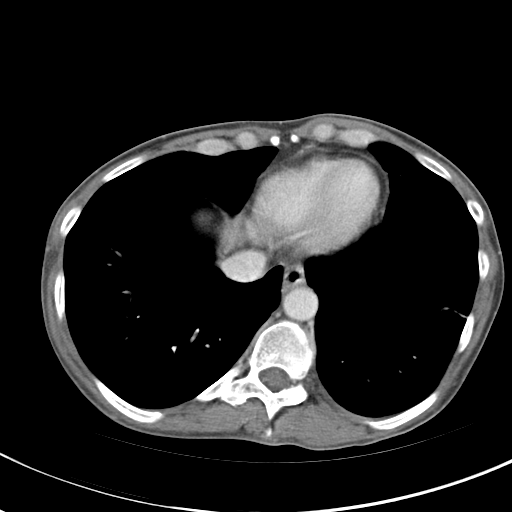

[Series 3: abd_pel_with 3.0 spo · coronal · 0.59mm/px · 3 of 65 slices shown]
[im 22/65  soft-tissue]
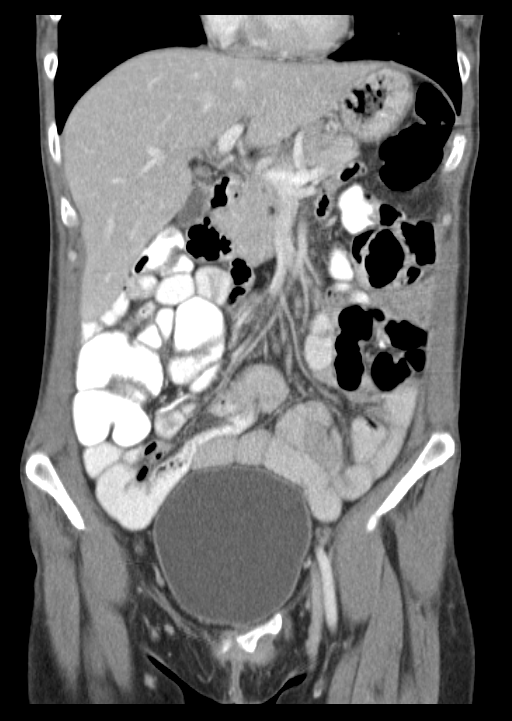
[im 29/65  soft-tissue]
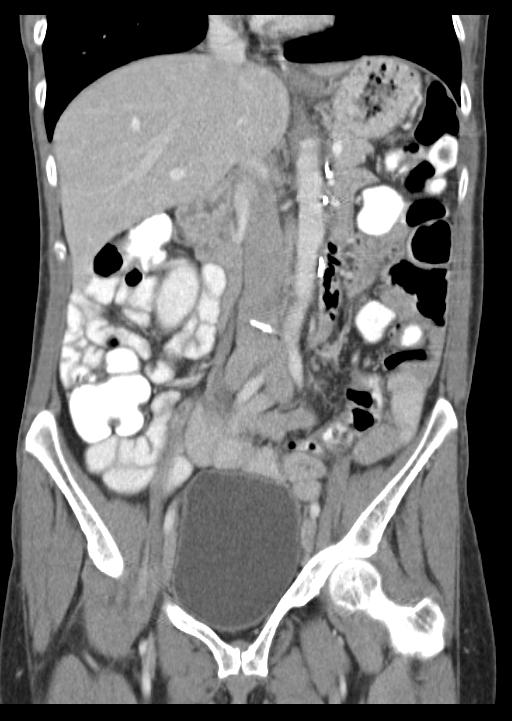
[im 36/65  soft-tissue]
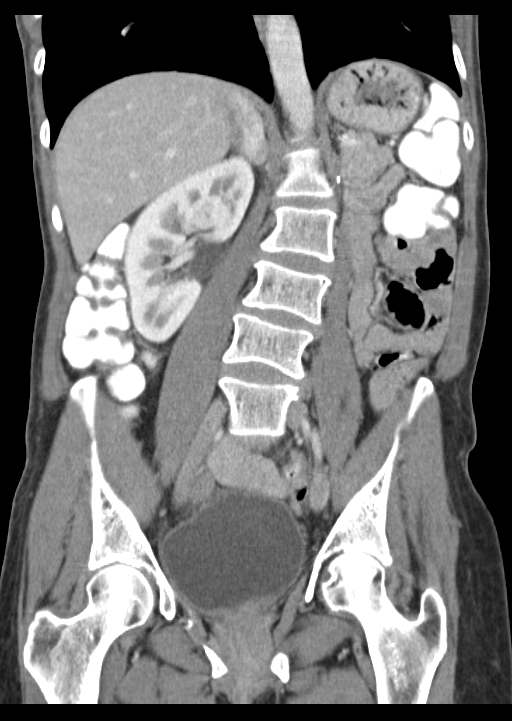

[16 of 46 positions shown; findings below may reference images not displayed]

FINDINGS: Focal scarring or atelectasis in the left lung base.

Surgical absence of the spleen and left kidney. Old left lower rib
fractures. The liver, gallbladder, pancreas, adrenal glands, right
kidney, abdominal aorta, inferior vena cava, and retroperitoneal
lymph nodes are unremarkable. Stomach, small bowel, and colon are
not abnormally distended. Contrast material flows through to the
colon without evidence of bowel obstruction. No wall thickening is
appreciated. No free air or free fluid in the abdomen.

Pelvis: Appendix is not identified. Uterus and ovaries are not
enlarged. There is a dominant follicle in the right ovary measuring
about 2 cm diameter. Bladder wall is not thickened. No free or
loculated pelvic fluid collections. No pelvic mass or
lymphadenopathy. No destructive bone lesions. Thoracolumbar
scoliosis convex towards the left.
IMPRESSION: No acute process demonstrated in the abdomen or pelvis. No evidence
of bowel obstruction or inflammation. Surgical absence of the spleen
and left kidney.
